# Patient Record
Sex: Female | Born: 1985 | Hispanic: Yes | Marital: Single | Smoking: Never smoker
Health system: Southern US, Community
[De-identification: ages and names within clinical notes are randomized; demographics above are authoritative.]

## PROBLEM LIST (undated history)

## (undated) ENCOUNTER — Inpatient Hospital Stay (HOSPITAL_COMMUNITY): Payer: Self-pay

## (undated) DIAGNOSIS — K219 Gastro-esophageal reflux disease without esophagitis: Secondary | ICD-10-CM

## (undated) DIAGNOSIS — Z789 Other specified health status: Secondary | ICD-10-CM

## (undated) HISTORY — PX: NO PAST SURGERIES: SHX2092

## (undated) HISTORY — DX: Other specified health status: Z78.9

---

## 2005-01-03 ENCOUNTER — Ambulatory Visit (HOSPITAL_COMMUNITY): Admission: RE | Admit: 2005-01-03 | Discharge: 2005-01-03 | Payer: Self-pay | Admitting: *Deleted

## 2005-05-29 ENCOUNTER — Ambulatory Visit: Payer: Self-pay | Admitting: Obstetrics and Gynecology

## 2005-05-29 ENCOUNTER — Inpatient Hospital Stay (HOSPITAL_COMMUNITY): Admission: AD | Admit: 2005-05-29 | Discharge: 2005-06-01 | Payer: Self-pay | Admitting: *Deleted

## 2006-10-25 ENCOUNTER — Encounter: Admission: RE | Admit: 2006-10-25 | Discharge: 2006-10-25 | Payer: Self-pay | Admitting: Occupational Medicine

## 2008-01-08 ENCOUNTER — Ambulatory Visit (HOSPITAL_COMMUNITY): Admission: RE | Admit: 2008-01-08 | Discharge: 2008-01-08 | Payer: Self-pay | Admitting: Obstetrics & Gynecology

## 2008-06-11 ENCOUNTER — Ambulatory Visit: Payer: Self-pay | Admitting: Obstetrics & Gynecology

## 2008-06-12 ENCOUNTER — Ambulatory Visit: Payer: Self-pay | Admitting: Advanced Practice Midwife

## 2008-06-12 ENCOUNTER — Inpatient Hospital Stay (HOSPITAL_COMMUNITY): Admission: AD | Admit: 2008-06-12 | Discharge: 2008-06-14 | Payer: Self-pay | Admitting: Obstetrics & Gynecology

## 2011-02-02 ENCOUNTER — Inpatient Hospital Stay (INDEPENDENT_AMBULATORY_CARE_PROVIDER_SITE_OTHER)
Admission: RE | Admit: 2011-02-02 | Discharge: 2011-02-02 | Disposition: A | Discharge status: Home / Self Care | Payer: Self-pay | Source: Ambulatory Visit | Attending: Family Medicine | Admitting: Family Medicine

## 2011-02-02 DIAGNOSIS — K2990 Gastroduodenitis, unspecified, without bleeding: Secondary | ICD-10-CM

## 2011-02-02 DIAGNOSIS — K2970 Gastritis, unspecified, without bleeding: Secondary | ICD-10-CM

## 2011-02-02 LAB — POCT URINALYSIS DIP (DEVICE)
Bilirubin Urine: NEGATIVE
Glucose, UA: NEGATIVE mg/dL
Hgb urine dipstick: NEGATIVE
Ketones, ur: NEGATIVE mg/dL
Nitrite: NEGATIVE
Specific Gravity, Urine: 1.015 (ref 1.005–1.030)

## 2011-02-02 LAB — POCT PREGNANCY, URINE: Preg Test, Ur: NEGATIVE

## 2011-06-04 LAB — CBC
HCT: 36.9
Hemoglobin: 12.5
MCHC: 33.8
Platelets: 189
RBC: 3.74 — ABNORMAL LOW
RDW: 13.9

## 2011-10-30 ENCOUNTER — Other Ambulatory Visit: Payer: Self-pay | Admitting: Family Medicine

## 2011-10-30 DIAGNOSIS — N63 Unspecified lump in unspecified breast: Secondary | ICD-10-CM

## 2011-11-05 ENCOUNTER — Ambulatory Visit
Admission: RE | Admit: 2011-11-05 | Discharge: 2011-11-05 | Disposition: A | Discharge status: Home / Self Care | Payer: Self-pay | Source: Ambulatory Visit | Attending: Family Medicine | Admitting: Family Medicine

## 2011-11-05 DIAGNOSIS — N63 Unspecified lump in unspecified breast: Secondary | ICD-10-CM

## 2012-09-03 NOTE — L&D Delivery Note (Signed)
Delivery Note At 9:03 PM a viable female was delivered via Vaginal, Spontaneous Delivery (Presentation: Left Occiput Anterior).  APGAR pending; weight pending.   Placenta status: Intact, Spontaneous. Cord: 3 vessels with the following complications: None.  Cord pH: not indicated  Anesthesia:none Episiotomy: n/a Lacerations: n/a Suture Repair: n/a Est. Blood Loss (mL): 350 ml  Mom to postpartum.  Baby to nursery-stable.  Lillia Abed 05/15/2013, 9:25 PM Evaluation and management procedures were performed by Resident physician (Dr. Mal Misty did delivery) under my supervision/collaboration. Chart reviewed, patient examined by me and I agree with management and plan.

## 2012-10-27 ENCOUNTER — Encounter (HOSPITAL_COMMUNITY): Payer: Self-pay | Admitting: *Deleted

## 2012-10-27 ENCOUNTER — Emergency Department (HOSPITAL_COMMUNITY)
Admission: EM | Admit: 2012-10-27 | Discharge: 2012-10-27 | Disposition: A | Discharge status: Home / Self Care | Payer: Self-pay | Attending: Emergency Medicine | Admitting: Emergency Medicine

## 2012-10-27 DIAGNOSIS — K219 Gastro-esophageal reflux disease without esophagitis: Secondary | ICD-10-CM | POA: Insufficient documentation

## 2012-10-27 DIAGNOSIS — O9989 Other specified diseases and conditions complicating pregnancy, childbirth and the puerperium: Secondary | ICD-10-CM | POA: Insufficient documentation

## 2012-10-27 DIAGNOSIS — O210 Mild hyperemesis gravidarum: Secondary | ICD-10-CM | POA: Insufficient documentation

## 2012-10-27 DIAGNOSIS — R1013 Epigastric pain: Secondary | ICD-10-CM | POA: Insufficient documentation

## 2012-10-27 HISTORY — DX: Gastro-esophageal reflux disease without esophagitis: K21.9

## 2012-10-27 LAB — CBC WITH DIFFERENTIAL/PLATELET
Basophils Relative: 0 % (ref 0–1)
Eosinophils Absolute: 0.1 10*3/uL (ref 0.0–0.7)
HCT: 31.8 % — ABNORMAL LOW (ref 36.0–46.0)
Lymphs Abs: 1.8 10*3/uL (ref 0.7–4.0)
Monocytes Absolute: 0.6 10*3/uL (ref 0.1–1.0)
Monocytes Relative: 8 % (ref 3–12)
Neutrophils Relative %: 68 % (ref 43–77)
RBC: 3.45 MIL/uL — ABNORMAL LOW (ref 3.87–5.11)
RDW: 12.3 % (ref 11.5–15.5)
WBC: 7.9 10*3/uL (ref 4.0–10.5)

## 2012-10-27 LAB — URINE MICROSCOPIC-ADD ON

## 2012-10-27 LAB — COMPREHENSIVE METABOLIC PANEL
AST: 15 U/L (ref 0–37)
Albumin: 3.5 g/dL (ref 3.5–5.2)
BUN: 14 mg/dL (ref 6–23)
CO2: 21 mEq/L (ref 19–32)
Calcium: 9.3 mg/dL (ref 8.4–10.5)
Chloride: 101 mEq/L (ref 96–112)
Creatinine, Ser: 0.48 mg/dL — ABNORMAL LOW (ref 0.50–1.10)
Total Protein: 7.5 g/dL (ref 6.0–8.3)

## 2012-10-27 LAB — URINALYSIS, ROUTINE W REFLEX MICROSCOPIC
Glucose, UA: NEGATIVE mg/dL
Hgb urine dipstick: NEGATIVE
Protein, ur: NEGATIVE mg/dL
pH: 6 (ref 5.0–8.0)

## 2012-10-27 LAB — LIPASE, BLOOD: Lipase: 50 U/L (ref 11–59)

## 2012-10-27 MED ORDER — ONDANSETRON 4 MG PO TBDP
8.0000 mg | ORAL_TABLET | Freq: Once | ORAL | Status: AC
  Administered 2012-10-27: 8 mg via ORAL
  Filled 2012-10-27: qty 2

## 2012-10-27 MED ORDER — LIDOCAINE VISCOUS 2 % MT SOLN
20.0000 mL | Freq: Once | OROMUCOSAL | Status: AC
  Administered 2012-10-27: 20 mL via OROMUCOSAL
  Filled 2012-10-27: qty 15

## 2012-10-27 MED ORDER — ONDANSETRON 4 MG PO TBDP: 4.0000 mg | ORAL_TABLET | Freq: Three times a day (TID) | ORAL | Status: DC | PRN

## 2012-10-27 NOTE — ED Notes (Signed)
Pt is [redacted] weeks pregnant and has been experiencing nausea and heartburn.  However, for he last week she has been vomiting 6 x's per day.  For the last 3 days she has been unable to eat anything and is vomiting up "yellow" emesis.  Denies diarrhea.

## 2012-10-27 NOTE — ED Provider Notes (Signed)
History     CSN: [id number]  Arrival date & time 10/27/12  1717   First MD Initiated Contact with Patient 10/27/12 2141      Chief Complaint  Patient presents with  . Abdominal Pain    HPI Rachael Jackson is a 27 y.o. female who presents to the ED with 1 week of burning epigastric pain and vomiting.  Pain moderate in severity, located in midepigastric region, no radiation, burning in sensation, worse with vomiting and lying flat, better with rest.  No diarrhea/constipation.  No changes in urination.  No other symptoms.  Past Medical History  Diagnosis Date  . GERD (gastroesophageal reflux disease)     History reviewed. No pertinent past surgical history.  Family History: Reviewed.  None pertinent.    History  Substance Use Topics  . Smoking status: Never Smoker   . Smokeless tobacco: Not on file  . Alcohol Use: No    Review of Systems  Constitutional: Negative for fever and chills.  HENT: Negative for congestion, rhinorrhea, neck pain and neck stiffness.   Respiratory: Negative for cough and shortness of breath.   Cardiovascular: Negative for chest pain.  Gastrointestinal: Positive for nausea, vomiting and abdominal pain. Negative for diarrhea and abdominal distention.  Endocrine: Negative for polyuria.  Genitourinary: Negative for dysuria.  Skin: Negative for rash.  Neurological: Negative for headaches.  Psychiatric/Behavioral: Negative.   All other systems reviewed and are negative.    Allergies  Review of patient's allergies indicates no known allergies.  Home Medications  No current outpatient prescriptions on file.  BP 94/53  Pulse 69  Temp(Src) 97.4 F (36.3 C) (Oral)  Resp 18  SpO2 98%  LMP 07/18/2012  Physical Exam  Nursing note and vitals reviewed. Constitutional: She is oriented to person, place, and time. She appears well-developed and well-nourished. No distress.  HENT:  Head: Normocephalic and atraumatic.  Right Ear: External ear  normal.  Left Ear: External ear normal.  Nose: Nose normal.  Mouth/Throat: Oropharynx is clear and moist. No oropharyngeal exudate.  Eyes: EOM are normal. Pupils are equal, round, and reactive to light.  Neck: Normal range of motion. Neck supple. No tracheal deviation present.  Cardiovascular: Normal rate.   Pulmonary/Chest: Effort normal and breath sounds normal. No stridor. No respiratory distress. She has no wheezes. She has no rales.  Abdominal: Soft. She exhibits no distension. There is tenderness in the epigastric area. There is no rebound.  Musculoskeletal: Normal range of motion.  Neurological: She is alert and oriented to person, place, and time.  Skin: Skin is warm and dry. She is not diaphoretic.    ED Course  Procedures (including critical care time)  Labs Reviewed  CBC WITH DIFFERENTIAL - Abnormal; Notable for the following:    RBC 3.45 (*)    Hemoglobin 11.4 (*)    HCT 31.8 (*)    All other components within normal limits  COMPREHENSIVE METABOLIC PANEL - Abnormal; Notable for the following:    Sodium 134 (*)    Creatinine, Ser 0.48 (*)    Total Bilirubin 0.2 (*)    All other components within normal limits  LIPASE, BLOOD  URINALYSIS, ROUTINE W REFLEX MICROSCOPIC   No results found.   1. GERD (gastroesophageal reflux disease)       MDM  [id number] at 14.3 weeks who presents to the ED with burning epigastric pain and vomiting similar to feelings she had with similar pregnancy.  Exam benign.  No concern for ectopic.  No suprapubic pain, RLQ pain or LLQ pain.  US showing intrauterine pregnancy with FHT of approx 150.  Will treat with zofran.  Patient much improved after viscous lidocaine.  No evidence of angina or MI symptoms.  Patient safe for discharge.  Patient discharged.   Arloa Koh, MD 10/28/12 0981

## 2012-10-27 NOTE — ED Notes (Signed)
Patient states that over the past few days she started hurting in the epigastric area.  +N/V at times esp after eating.  Stated she had this trouble with her other 2 preg. But the pain did not last as long.

## 2012-10-28 NOTE — ED Provider Notes (Signed)
  I performed a history and physical examination of Rachael Jackson and discussed her management with Dr. Piedad Climes.  I agree with the history, physical, assessment, and plan of care, with the following exceptions: None    Rhet Rorke, Elvis Coil, MD 10/28/12 [phone]

## 2012-10-30 LAB — URINE CULTURE

## 2012-10-31 ENCOUNTER — Telehealth (HOSPITAL_COMMUNITY): Payer: Self-pay | Admitting: Emergency Medicine

## 2012-10-31 NOTE — ED Notes (Signed)
+  Urine. 15,000 colonies. No need for further treatment.

## 2012-10-31 NOTE — ED Notes (Signed)
Patient has +Urine culture. Checking to see if appropriately treated. °

## 2012-11-19 ENCOUNTER — Emergency Department (HOSPITAL_COMMUNITY)
Admission: EM | Admit: 2012-11-19 | Discharge: 2012-11-19 | Disposition: A | Discharge status: Home / Self Care | Payer: Self-pay | Source: Home / Self Care

## 2012-11-19 ENCOUNTER — Encounter (HOSPITAL_COMMUNITY): Payer: Self-pay

## 2012-11-19 DIAGNOSIS — K089 Disorder of teeth and supporting structures, unspecified: Secondary | ICD-10-CM

## 2012-11-19 DIAGNOSIS — K0889 Other specified disorders of teeth and supporting structures: Secondary | ICD-10-CM

## 2012-11-19 NOTE — ED Notes (Signed)
Patient complains of bilateral tooth pain Has a rash on arms Purple nail on foot

## 2012-11-19 NOTE — ED Notes (Signed)
[facility]

## 2012-11-19 NOTE — ED Notes (Signed)
Patient Demographics  Rachael Jackson, is a 27 y.o. female  ZOX:[id number]  WJX:[id number]  DOB - 14-Nov-1985  Chief Complaint  Patient presents with  . Dental Pain  . Rash        Subjective:   Rachael Jackson  who is 4 months pregnant presents to get a referral for her dentist as she is developing some sensitivity to cold and hot liquids in her right upper molar and left lower molar. She has had dental filling done about one year ago in one of these tooth, no jaw swelling, no fever chills, no tenderness.  And a viral-like illness one week ago after which she developed a small non-itchy macular rash in the right forearm area, the rash is itself resolving    Objective:    Filed Vitals:   11/19/12 1116  BP: 107/49  Pulse: 114 repeat bedside 88   Temp: 97.7 F (36.5 C)  TempSrc: Oral  SpO2: 100%     Exam  Awake Alert, Oriented X 3, No new F.N deficits, Normal affect [place].AT,PERRAL Supple Neck,No JVD, No cervical lymphadenopathy appriciated.  Symmetrical Chest wall movement, Good air movement bilaterally, CTAB RRR,No Gallops,Rubs or new Murmurs, No Parasternal Heave +ve B.Sounds, Abd Soft, Non tender, No organomegaly appriciated, No rebound - guarding or rigidity. No Cyanosis, Clubbing or edema, No new Rash or bruise Right forearm has a small area of macular rash which is resolving this happened about one week ago after a viral URI. Its not itchy    Data Review   CBC No results found for this basename: WBC, HGB, HCT, PLT, MCV, MCH, MCHC, RDW, NEUTRABS, LYMPHSABS, MONOABS, EOSABS, BASOSABS, BANDABS, BANDSABD,  in the last 168 hours  Chemistries   No results found for this basename: NA, K, CL, CO2, GLUCOSE, BUN, CREATININE, GFRCGP, CALCIUM, MG, AST, ALT, ALKPHOS, BILITOT,  in the last 168 hours ------------------------------------------------------------------------------------------------------------------ No results found for this basename: HGBA1C,  in the  last 72 hours ------------------------------------------------------------------------------------------------------------------ No results found for this basename: CHOL, HDL, LDLCALC, TRIG, CHOLHDL, LDLDIRECT,  in the last 72 hours ------------------------------------------------------------------------------------------------------------------ No results found for this basename: TSH, T4TOTAL, FREET3, T3FREE, THYROIDAB,  in the last 72 hours ------------------------------------------------------------------------------------------------------------------ No results found for this basename: VITAMINB12, FOLATE, FERRITIN, TIBC, IRON, RETICCTPCT,  in the last 72 hours  Coagulation profile  No results found for this basename: INR, PROTIME,  in the last 168 hours     Prior to Admission medications   Medication Sig Start Date End Date Taking? Authorizing Provider  ondansetron (ZOFRAN-ODT) 4 MG disintegrating tablet Take 1 tablet (4 mg total) by mouth every 8 (eight) hours as needed for nausea. 10/27/12   Arloa Koh, MD     Assessment & Plan   Dental sensitivity and pain to hot and cold liquids. Exam is stable, no signs of abscess, no fever chills, have referred her to dentist Dr. Mayford Knife, advise her to brush her teeth twice a day, if she develops any jaw swelling fever chills to come back early.   4 months pregnant patient with viral URI 1 week ago with a small macular rash in the right forearm which is now resolving, rash is not itchy, have requested her to see her OB/GYN immediately.    Follow-up Information   Follow up with [facility] physician in a week. (Show them ear rash in the right arm)        Leroy Sea M.D on 11/19/2012 at 11:30 AM   Leroy Sea, MD 11/19/12 [phone]

## 2012-11-25 ENCOUNTER — Inpatient Hospital Stay (HOSPITAL_COMMUNITY): Payer: No Typology Code available for payment source

## 2012-11-25 ENCOUNTER — Inpatient Hospital Stay (HOSPITAL_COMMUNITY)
Admission: AD | Admit: 2012-11-25 | Discharge: 2012-11-25 | Disposition: A | Discharge status: Home / Self Care | Payer: No Typology Code available for payment source | Source: Ambulatory Visit | Attending: Family Medicine | Admitting: Family Medicine

## 2012-11-25 ENCOUNTER — Encounter (HOSPITAL_COMMUNITY): Payer: Self-pay

## 2012-11-25 DIAGNOSIS — O2390 Unspecified genitourinary tract infection in pregnancy, unspecified trimester: Secondary | ICD-10-CM | POA: Insufficient documentation

## 2012-11-25 DIAGNOSIS — N72 Inflammatory disease of cervix uteri: Secondary | ICD-10-CM | POA: Insufficient documentation

## 2012-11-25 DIAGNOSIS — R1031 Right lower quadrant pain: Secondary | ICD-10-CM | POA: Insufficient documentation

## 2012-11-25 DIAGNOSIS — R1032 Left lower quadrant pain: Secondary | ICD-10-CM | POA: Insufficient documentation

## 2012-11-25 DIAGNOSIS — M549 Dorsalgia, unspecified: Secondary | ICD-10-CM | POA: Insufficient documentation

## 2012-11-25 DIAGNOSIS — O2692 Pregnancy related conditions, unspecified, second trimester: Secondary | ICD-10-CM

## 2012-11-25 DIAGNOSIS — N949 Unspecified condition associated with female genital organs and menstrual cycle: Secondary | ICD-10-CM | POA: Insufficient documentation

## 2012-11-25 LAB — CBC
HCT: 28.3 % — ABNORMAL LOW (ref 36.0–46.0)
Hemoglobin: 9.9 g/dL — ABNORMAL LOW (ref 12.0–15.0)
MCHC: 35 g/dL (ref 30.0–36.0)
MCV: 93.1 fL (ref 78.0–100.0)
WBC: 7.4 10*3/uL (ref 4.0–10.5)

## 2012-11-25 LAB — URINALYSIS, ROUTINE W REFLEX MICROSCOPIC
Bilirubin Urine: NEGATIVE
Ketones, ur: NEGATIVE mg/dL
Leukocytes, UA: NEGATIVE
Nitrite: NEGATIVE
Protein, ur: NEGATIVE mg/dL
Urobilinogen, UA: 0.2 mg/dL (ref 0.0–1.0)

## 2012-11-25 LAB — WET PREP, GENITAL
Trich, Wet Prep: NONE SEEN
Yeast Wet Prep HPF POC: NONE SEEN

## 2012-11-25 MED ORDER — ACETAMINOPHEN 325 MG PO TABS
650.0000 mg | ORAL_TABLET | Freq: Once | ORAL | Status: AC
  Administered 2012-11-25: 650 mg via ORAL
  Filled 2012-11-25: qty 2

## 2012-11-25 MED ORDER — AZITHROMYCIN 250 MG PO TABS
1000.0000 mg | ORAL_TABLET | Freq: Once | ORAL | Status: AC
  Administered 2012-11-25: 1000 mg via ORAL
  Filled 2012-11-25: qty 4

## 2012-11-25 NOTE — MAU Provider Note (Signed)
Chart reviewed and agree with management and plan.  

## 2012-11-25 NOTE — MAU Provider Note (Signed)
History     CSN: [id number]  Arrival date and time: 11/25/12 1405   First Provider Initiated Contact with Patient 11/25/12 1506      Chief Complaint  Patient presents with  . Vaginal Discharge  . Back Pain   Vaginal Discharge The patient's primary symptoms include a vaginal discharge. Associated symptoms include back pain.  Back Pain  Pt is a 27 y/o G3P2 at GA [redacted] wks (by LMP, which is uncertain) who presents today for brown vaginal discharge and lower back pain. She reports that the lower back pain started yesterday afternoon and is worse when standing up and walking. It is better when sitting. It is located at her lumbo-sacral area bilaterally. There is no radiation. She is not aware of any injuries. She describes it as sharp and rates it as 8/10. It currently does not hurt.  She also reports some abnormal vaginal discharge beginning today. She describes it as thick and brown. It has been a small amount and not enough to require a pad. She has not seen any clots or tissue. She denies any vaginal itching, pain/discomfort, or blood. She denies dysuria, frequency. She has been sexually active only with her husband and for only several times in the last 3 months.  She is also complaining of abdominal pain, located in her LLQ and RLQ. 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   She is anxious and worried that there may be a problem with her pregnancy since she never experienced these symptoms with previous pregnancies. She has not had any prenatal care as of yet. She is in the [employer] program and has her first prenatal appointment scheduled on 12/04/12. She is taking a prenatal vitamin.   Past Medical History  Diagnosis Date  . GERD (gastroesophageal reflux disease)     Past Surgical History  Procedure Laterality Date  . No past surgeries      Family History  Problem Relation Age of Onset  . Alcohol abuse Neg Hx      History  Substance Use Topics  . Smoking status: Never Smoker   . Smokeless tobacco: Not on file  . Alcohol Use: No    Allergies: No Known Allergies  Prescriptions prior to admission  Medication Sig Dispense Refill  . Prenatal Vit-Fe Fumarate-FA (PRENATAL MULTIVITAMIN) TABS Take 1 tablet by mouth daily at 12 noon.        Review of Systems  Genitourinary: Positive for vaginal discharge.  Musculoskeletal: Positive for back pain.   Pertinent positives and negatives discussed in HPI. Physical Exam   Blood pressure 113/63, pulse 87, temperature 97.9 F (36.6 C), temperature source Oral, resp. rate 16, height 5' 1.5" (1.562 m), weight 64.071 kg (141 lb 4 oz), last menstrual period 07/18/2012.  Physical Exam  Constitutional: She appears well-developed and well-nourished. She appears distressed (worried).  HENT:  Head: Normocephalic.  Neck: Normal range of motion.  Respiratory: Effort normal. No respiratory distress.  GI: Soft. She exhibits no distension. There is tenderness in the right lower quadrant and left lower quadrant. There is no rigidity, no guarding and no CVA tenderness.  Genitourinary: Uterus normal. There is no rash, tenderness or lesion on the right labia. There is no rash, tenderness or lesion on the left labia. Cervix exhibits motion tenderness, discharge (clear, thick) and friability. Right adnexum displays tenderness. Right adnexum displays no mass and no fullness. Left adnexum displays tenderness.  Left adnexum displays no mass and no fullness. No erythema, tenderness or bleeding around the vagina. No signs of injury around the vagina. No vaginal discharge found.  Psychiatric: She has a normal mood and affect. Her behavior is normal.   Results for orders placed during the hospital encounter of 11/25/12 (from the past 24 hour(s))  URINALYSIS, ROUTINE W REFLEX MICROSCOPIC     Status: Abnormal   Collection Time    11/25/12  2:24 PM      Result Value Range   Color,  Urine YELLOW  YELLOW   APPearance CLEAR  CLEAR   Specific Gravity, Urine >1.030 (*) 1.005 - 1.030   pH 6.0  5.0 - 8.0   Glucose, UA NEGATIVE  NEGATIVE mg/dL   Hgb urine dipstick NEGATIVE  NEGATIVE   Bilirubin Urine NEGATIVE  NEGATIVE   Ketones, ur NEGATIVE  NEGATIVE mg/dL   Protein, ur NEGATIVE  NEGATIVE mg/dL   Urobilinogen, UA 0.2  0.0 - 1.0 mg/dL   Nitrite NEGATIVE  NEGATIVE   Leukocytes, UA NEGATIVE  NEGATIVE  ABO/RH     Status: None   Collection Time    11/25/12  3:36 PM      Result Value Range   ABO/RH(D) O POS    CBC     Status: Abnormal   Collection Time    11/25/12  3:36 PM      Result Value Range   WBC 7.4  4.0 - 10.5 K/uL   RBC 3.04 (*) 3.87 - 5.11 MIL/uL   Hemoglobin 9.9 (*) 12.0 - 15.0 g/dL   HCT 09.8 (*) 11.9 - 14.7 %   MCV 93.1  78.0 - 100.0 fL   MCH 32.6  26.0 - 34.0 pg   MCHC 35.0  30.0 - 36.0 g/dL   RDW 82.9  56.2 - 13.0 %   Platelets 190  150 - 400 K/uL  WET PREP, GENITAL     Status: Abnormal   Collection Time    11/25/12  3:50 PM      Result Value Range   Yeast Wet Prep HPF POC NONE SEEN  NONE SEEN   Trich, Wet Prep NONE SEEN  NONE SEEN   Clue Cells Wet Prep HPF POC NONE SEEN  NONE SEEN   WBC, Wet Prep HPF POC MODERATE (*) NONE SEEN    MAU Course  Procedures  Pt is a G3P2 at [redacted]w[redacted]d by U/S today who presented for brown vaginal discharge, lower back pain, and lower abdominal pain. Pt had a friable cervix and cervical tenderness on PE, and wet prep and U/A were unremarkable. GC/Chlamydia are pending but treating today for cervicitis presumptively based on clinical appearance. Suspect that back pain is due to ligament pain.   Assessment and Plan  Acute Cervicitis - GC/Chlamydia - will call if results positive - wet prep - ultrasound - CBC - Hgb = 9.9. Recommended she take a PNV with extra iron or an iron supplement. Follow-up at prenatal appointment. - ABO/Rh - Rx. Azithromycin 1g PO x 1 - Return to MAU if develops a fever, experiences vaginal  bleeding, worsening pain or discharge  Lorna Dibble, PA-S 11/25/2012, 3:28 PM   27 y.o. female with brown discharge in second trimester pregnancy. I have examined this patient and treated her for cervicitis. She is feeling better after tylenol and is stable for discharge home. We have discussed her mild anemia and need for prenatal vitamins with iron and healthy diet. Patient voices understanding.  I have reviewed  this patient's vital signs, nurses notes, appropriate labs and imaging.

## 2012-11-25 NOTE — MAU Note (Signed)
Pt states unsure of actual LMP, roughly mid to end of November. Here for brown vaginal discharge and back pain. Intermittent cramping, rates 5/10 now, this am was much worse.

## 2012-11-26 LAB — GC/CHLAMYDIA PROBE AMP
CT Probe RNA: NEGATIVE
GC Probe RNA: NEGATIVE

## 2012-12-04 ENCOUNTER — Other Ambulatory Visit: Payer: No Typology Code available for payment source

## 2012-12-04 DIAGNOSIS — Z331 Pregnant state, incidental: Secondary | ICD-10-CM

## 2012-12-04 NOTE — Progress Notes (Signed)
PRENATAL LABS DONE TODAY Marlita Keil 

## 2012-12-05 LAB — SICKLE CELL SCREEN: Sickle Cell Screen: NEGATIVE

## 2012-12-05 LAB — HIV ANTIBODY (ROUTINE TESTING W REFLEX): HIV: NONREACTIVE

## 2012-12-06 LAB — OBSTETRIC PANEL
Antibody Screen: NEGATIVE
Basophils Relative: 0 % (ref 0–1)
Eosinophils Absolute: 0.1 10*3/uL (ref 0.0–0.7)
Eosinophils Relative: 1 % (ref 0–5)
Hemoglobin: 11.3 g/dL — ABNORMAL LOW (ref 12.0–15.0)
Lymphs Abs: 1.2 10*3/uL (ref 0.7–4.0)
MCH: 31.8 pg (ref 26.0–34.0)
MCHC: 32.9 g/dL (ref 30.0–36.0)
MCV: 96.6 fL (ref 78.0–100.0)
Monocytes Relative: 6 % (ref 3–12)
RBC: 3.55 MIL/uL — ABNORMAL LOW (ref 3.87–5.11)
Rh Type: POSITIVE

## 2012-12-10 ENCOUNTER — Ambulatory Visit (INDEPENDENT_AMBULATORY_CARE_PROVIDER_SITE_OTHER): Payer: No Typology Code available for payment source | Admitting: Family Medicine

## 2012-12-10 ENCOUNTER — Other Ambulatory Visit: Payer: Self-pay | Admitting: Family Medicine

## 2012-12-10 ENCOUNTER — Encounter: Payer: Self-pay | Admitting: Family Medicine

## 2012-12-10 VITALS — BP 101/48 | Temp 97.8°F | Wt 144.4 lb

## 2012-12-10 DIAGNOSIS — Z3401 Encounter for supervision of normal first pregnancy, first trimester: Secondary | ICD-10-CM

## 2012-12-10 DIAGNOSIS — Z3490 Encounter for supervision of normal pregnancy, unspecified, unspecified trimester: Secondary | ICD-10-CM | POA: Insufficient documentation

## 2012-12-10 DIAGNOSIS — Z3400 Encounter for supervision of normal first pregnancy, unspecified trimester: Secondary | ICD-10-CM

## 2012-12-10 DIAGNOSIS — Z124 Encounter for screening for malignant neoplasm of cervix: Secondary | ICD-10-CM

## 2012-12-10 LAB — GLUCOSE, CAPILLARY: Comment 1: 1

## 2012-12-10 NOTE — Patient Instructions (Addendum)
Ha sido un placer verle hoy. - Por favor tome las tabletas prenatales 1 tableta diaria - Yo le comunicare si los resultados de sus analisis estan [place], de lo contrario lo conversaremos en su proxima cita. - Haga su proxima cita en 4 semanas.

## 2012-12-10 NOTE — Progress Notes (Signed)
Rachael Jackson is a 27 y.o. G3P2002 at [redacted]w[redacted]d. That comes for her first OB visit. PMHx and PFHx unremarkable. OB Hx: 2 Uncomplicated pregnancies with SVD of term/normal weight babies.  On this pregnancy: She reports had bloody tinted vaginal discharge and went to [facility], cultures for [facility] and CT were taken and was treated with Azithromycin resolving her symptoms. She also report Hx of GERD with epigastric pain that also has resolved. She reports feeling well and has not present complaints.  Physical Exam: Gen:  NAD HEENT: Moist mucous membranes. Neck supple. No goiter or adenopathies.  CV: Regular rate and rhythm, no murmurs rubs or gallops PULM: Clear to auscultation bilaterally. No wheezes/rales/rhonchi Beasts: no masses, normal skin, no nipple discharge. No axillary admopathies ABD: Soft, non tender, normal bowel sounds EXT: No edema Neuro: Alert and oriented x3. No focalization  Psych: Normal mood and affect. Vulva and perianal area: Normal, mild varices on left labia majora. Speculum: Vagina and cervix of normal appearance, no friability, thin white discharge. Bimanual exam: Uterus anteverted, no adnexal masses. No cervical motion tenderness. Closed cervix.  A/P Declines genetic testing. PHQ-9 scored 1 on item #3 (trouble sleeping too much) [facility] PMH form filled and no Risks identified. Pap smear and GC/CT obtained today Labs reviewed and WNL. 1h GTT obtained today as well as Varicella titers OB U/S at 20 weeks. Bleeding and pain precautions discussed. Continue prenatal vitamins. Next appointment in 4 weeks.

## 2012-12-11 ENCOUNTER — Encounter: Payer: Self-pay | Admitting: Family Medicine

## 2013-01-06 ENCOUNTER — Ambulatory Visit (INDEPENDENT_AMBULATORY_CARE_PROVIDER_SITE_OTHER): Payer: No Typology Code available for payment source | Admitting: Family Medicine

## 2013-01-06 VITALS — BP 97/54 | Temp 98.1°F | Wt 148.4 lb

## 2013-01-06 DIAGNOSIS — Z3490 Encounter for supervision of normal pregnancy, unspecified, unspecified trimester: Secondary | ICD-10-CM

## 2013-01-06 DIAGNOSIS — Z3480 Encounter for supervision of other normal pregnancy, unspecified trimester: Secondary | ICD-10-CM

## 2013-01-06 DIAGNOSIS — Z3689 Encounter for other specified antenatal screening: Secondary | ICD-10-CM

## 2013-01-06 NOTE — Patient Instructions (Addendum)
Mtodo para contar los movimientos fetales (Fetal Movement Counts) Nombre de la paciente: __________________________________________________ Rachael Jackson probable de parto:____________________ En los embarazos de alto riesgo se recomienda contar las pataditas, pero tambin es una buena idea que lo hagan todas las [place]. Comience a contarlas a las 28 semanas de embarazo. Los movimientos fetales aumentan luego de una comida Immunologist o de comer o beber algo dulce (el nivel de azcar en la sangre est ms alto). Tambin es importante beber gran cantidad de lquidos (hidratarse bien) antes de contar. Si se recuesta sobre el lado izquierdo mejorar la Designer, industrial/product, o puede sentarse en una silla cmoda con los brazos sobre el abdomen y sin distracciones que la rodeen. CONTANDO  Trate de contar a la [employer] lo haga.  Marque el da y la hora y vea cunto le lleva sentir 10 movimientos (patadas, agitaciones, sacudones, vueltas). Debe sentir al menos 10 movimientos en 2 horas. Probablemente sienta los 10 movimientos en menos de dos horas. Si no los siente, espere una hora y cuente nuevamente. Luego de [employer] tendr un patrn.  Debemos observar si hay cambios en el patrn o no hay suficientes pataditas en 2 horas. Le lleva ms tiempo contar los 10 movimientos? SOLICITE ATENCIN MDICA SI:  Siente menos de 10 pataditas en 2 horas. Intntelo dos veces.  No siente movimientos durante 1 hora.  El patrn se modifica o le lleva ms tiempo Art gallery manager las 10 pataditas.  Siente que el beb no se mueve como lo hace habitualmente. Fecha: ____________ Movimientos: ____________ Comienzo hora: ____________ Cephas Darby: ____________ Rachael Jackson: ____________ Movimientos: ____________ Comienzo hora: ____________ Cephas Darby: ____________ Rachael Jackson: ____________ Movimientos: ____________ Comienzo hora: ____________ Cephas Darby: ____________ Rachael Jackson: ____________ Movimientos: ____________ Comienzo hora:  ____________ Cephas Darby: ____________ Rachael Jackson: ____________ Movimientos: ____________ Comienzo hora: ____________ Cephas Darby: ____________ Rachael Jackson: ____________ Movimientos: ____________ Comienzo hora: ____________ Cephas Darby: ____________ Rachael Jackson: ____________ Movimientos: ____________ Comienzo hora: ____________ Cephas Darby: ____________  Rachael Jackson: ____________ Movimientos: ____________ Comienzo hora: ____________ Cephas Darby: ____________ Rachael Jackson: ____________ Movimientos: ____________ Comienzo hora: ____________ Cephas Darby: ____________ Rachael Jackson: ____________ Movimientos: ____________ Comienzo hora: ____________ Cephas Darby: ____________ Rachael Jackson: ____________ Movimientos: ____________ Comienzo hora: ____________ Cephas Darby: ____________ Rachael Jackson: ____________ Movimientos: ____________ Comienzo hora: ____________ Cephas Darby: ____________ Rachael Jackson: ____________ Movimientos: ____________ Comienzo hora: ____________ Cephas Darby: ____________ Rachael Jackson: ____________ Movimientos: ____________ Comienzo hora: ____________ Cephas Darby: ____________  Rachael Jackson: ____________ Movimientos: ____________ Comienzo hora: ____________ Cephas Darby: ____________ Rachael Jackson: ____________ Movimientos: ____________ Comienzo hora: ____________ Cephas Darby: ____________ Rachael Jackson: ____________ Movimientos: ____________ Comienzo hora: ____________ Fin hora: ____________  Document Released: 11/27/2007 Document Revised: 11/12/2011 ExitCare Patient Information 2013 [employer]  Haz una cita en 4 semanas

## 2013-01-06 NOTE — Progress Notes (Signed)
Rachael Jackson is a 27 y.o. G3P2002 at [redacted]w[redacted]d for routine follow up.  She reports no complaints.  See flow sheet for details.  A/P: Pregnancy at [redacted]w[redacted]d.  Doing well.   Pregnancy issues include none.  1h GTT wnl. GC/CT and pap smear negative. Anatomy scan ordered today.  Preterm labor precautions reviewed. Follow up 4 weeks.

## 2013-01-08 ENCOUNTER — Ambulatory Visit (HOSPITAL_COMMUNITY)
Admission: RE | Admit: 2013-01-08 | Discharge: 2013-01-08 | Disposition: A | Discharge status: Home / Self Care | Payer: No Typology Code available for payment source | Source: Ambulatory Visit | Attending: Family Medicine | Admitting: Family Medicine

## 2013-01-08 DIAGNOSIS — Z1389 Encounter for screening for other disorder: Secondary | ICD-10-CM | POA: Insufficient documentation

## 2013-01-08 DIAGNOSIS — O358XX0 Maternal care for other (suspected) fetal abnormality and damage, not applicable or unspecified: Secondary | ICD-10-CM | POA: Insufficient documentation

## 2013-01-08 DIAGNOSIS — Z363 Encounter for antenatal screening for malformations: Secondary | ICD-10-CM | POA: Insufficient documentation

## 2013-01-08 DIAGNOSIS — Z3689 Encounter for other specified antenatal screening: Secondary | ICD-10-CM

## 2013-01-14 ENCOUNTER — Encounter: Payer: Self-pay | Admitting: Family Medicine

## 2013-01-14 ENCOUNTER — Telehealth: Payer: Self-pay | Admitting: Family Medicine

## 2013-01-14 ENCOUNTER — Ambulatory Visit (INDEPENDENT_AMBULATORY_CARE_PROVIDER_SITE_OTHER): Payer: No Typology Code available for payment source | Admitting: Family Medicine

## 2013-01-14 VITALS — BP 101/51 | HR 89 | Temp 97.6°F | Ht 61.5 in | Wt 150.0 lb

## 2013-01-14 DIAGNOSIS — L282 Other prurigo: Secondary | ICD-10-CM | POA: Insufficient documentation

## 2013-01-14 MED ORDER — TRIAMCINOLONE ACETONIDE 0.025 % EX OINT: TOPICAL_OINTMENT | Freq: Two times a day (BID) | CUTANEOUS | Status: DC

## 2013-01-14 MED ORDER — CETIRIZINE HCL 10 MG PO TABS: 10.0000 mg | ORAL_TABLET | Freq: Every day | ORAL | Status: DC | PRN

## 2013-01-14 NOTE — Telephone Encounter (Signed)
Has a rash all over that's itchy and she is 5 mo pregnant.  Wants to know what she can do

## 2013-01-14 NOTE — Patient Instructions (Signed)
You may have allergic reaction related to grass, sensitive during pregnancy. Use the triamcinolone ointment for rash. You may take cetirizine pill at night for itching if desired. Please make sure to call if symptoms worsen or fail to improve.

## 2013-01-14 NOTE — Progress Notes (Signed)
  Subjective:    Patient ID: Rachael Jackson, female    DOB: May 08, 1986, 27 y.o.   MRN: [id number]  Rash    27 yo G3P2 in 2nd trimester complaint of itchy rash. This started on Sunday after she was sitting on the grass with bare legs. Noted some itching on the back of her legs. Over the course of the day this worsened and spread to her back, arms, abdomen. She notes it has been stable, causing significant pruritus. There is some redness and rough skin. She has tried to use lemon, alcohol, OTC cortisone that helped mildly. She otherwise feels well, denies any nausea, emesis, fever, chills, swelling, pain. No family members have a similar rash. She is accompanied by her 2 small sons today.  She notes good fetal movement, no vaginal bleeding or discharge or abdominal pain.  Review of Systems  Skin: Positive for rash.   See HPI otherwise negative.  reports that she has never smoked. She does not have any smokeless tobacco history on file.     Objective:   Physical Exam  Vitals reviewed. Constitutional: She is oriented to person, place, and time. She appears well-developed and well-nourished. No distress.  HENT:  Head: Normocephalic and atraumatic.  Mouth/Throat: Oropharynx is clear and moist. No oropharyngeal exudate.  Eyes: EOM are normal. Pupils are equal, round, and reactive to light.  Neck: Normal range of motion.  Cardiovascular: Normal rate, regular rhythm and normal heart sounds.   No murmur heard. Pulmonary/Chest: Effort normal and breath sounds normal.  Abdominal: There is no tenderness.  Neurological: She is alert and oriented to person, place, and time.  Skin: She is not diaphoretic.  Generalized fine sandpaper type maculopapular rash with dry scaly skin noted posterior legs, back, abdomen, flexural surfaces of arms. There is mild erythema and faint excoriations. There are no pustules, ulcers or bleeding tenderness.          Assessment & Plan:

## 2013-01-14 NOTE — Assessment & Plan Note (Signed)
Likely atopic versus contact dermatitis from sitting on the grass. Also less likely prurigo of pregnancy. Do not think this represents one of the pathologic prurigo of pregnancy given her notable rash and recent exposure to allergens. Recommend topical steroid treatment with low potency steroid. She may try cetirizine each bedtime when necessary for itching. Discussed avoiding unnecessary medications if possible. Followup if symptoms worsen or fail to improve.

## 2013-02-04 ENCOUNTER — Ambulatory Visit (INDEPENDENT_AMBULATORY_CARE_PROVIDER_SITE_OTHER): Payer: No Typology Code available for payment source | Admitting: Family Medicine

## 2013-02-04 ENCOUNTER — Encounter: Payer: Self-pay | Admitting: Family Medicine

## 2013-02-04 VITALS — BP 91/44 | Temp 98.1°F | Wt 151.0 lb

## 2013-02-04 DIAGNOSIS — Z3490 Encounter for supervision of normal pregnancy, unspecified, unspecified trimester: Secondary | ICD-10-CM

## 2013-02-04 DIAGNOSIS — Z331 Pregnant state, incidental: Secondary | ICD-10-CM

## 2013-02-04 NOTE — Patient Instructions (Addendum)
Evaluacin de los movimientos fetales  (Fetal Movement Counts) [place] del paciente: Rachael Jackson       [place] de parto estimada: 05/15/2013 La evaluacin de los movimientos fetales es muy recomendable en los embarazos de alto riesgo, pero tambin es una buena idea que lo hagan todas las [place]. El Firefighter que comience a contarlos a las 28 semanas de [place]. Los movimientos fetales suelen aumentar:   Despus de Animator.  Despus de la actividad fsica.  Despus de comer o beber [employer] o fro.  En reposo. Preste atencin cuando sienta que el beb est ms activo. Esto le ayudar a notar un patrn de ciclos de vigilia y sueo de su beb y cules son los factores que contribuyen a un aumento de los movimientos fetales. Es importante llevar a cabo un recuento de movimientos fetales, al mismo tiempo cada da, cuando el beb normalmente est ms activo.  CMO CONTAR LOS MOVIMIENTOS FETALES 1. Busque un lugar tranquilo y cmodo para sentarse o recostarse sobre el lado izquierdo. Al recostarse sobre su lado izquierdo, le proporciona una mejor circulacin de [place] y oxgeno al beb. 2. Anote el da y la hora en una hoja de papel o en un diario. 3. Comience contando las pataditas, revoloteos, chasquidos, vueltas o pinchazos en un perodo de 2 horas. Debe sentir al menos 10 movimientos en 2 horas. 4. Si no siente 10 movimientos en 2 horas, espere 2  3 horas y cuente de nuevo. Busque cambios en el patrn o si no cuenta lo suficiente en 2 horas. SOLICITE ATENCIN MDICA SI:   Siente menos de 10 pataditas en 2 horas, en dos intentos.  No hay movimientos durante una hora.  El patrn se modifica o le lleva ms tiempo Art gallery manager las 10 pataditas.  Siente que el beb no se mueve como lo hace habitualmente. Fecha: ____________ Movimientos: ____________ Stevan Born inicio: ____________ Stevan Born finalizacin: ____________  Franco Nones: ____________ Movimientos:  ____________ Stevan Born inicio: ____________ Stevan Born finalizacin: ____________  Franco Nones: ____________ Movimientos: ____________ Stevan Born inicio: ____________ Stevan Born finalizacin: ____________  Franco Nones: ____________ Movimientos: ____________ Stevan Born inicio: ____________ Stevan Born finalizacin: ____________  Franco Nones: ____________ Movimientos: ____________ Stevan Born inicio: ____________ Mammie Russian de finalizacin: ____________  Franco Nones: ____________ Movimientos: ____________ Mammie Russian de inicio: ____________ Mammie Russian de finalizacin: ____________  Franco Nones: ____________ Movimientos: ____________ Mammie Russian de inicio: ____________ Mammie Russian de finalizacin: ____________  Franco Nones: ____________ Movimientos: ____________ Mammie Russian de inicio: ____________ Mammie Russian de finalizacin: ____________  Franco Nones: ____________ Movimientos: ____________ Stevan Born inicio: ____________ Mammie Russian de finalizacin: ____________  Franco Nones: ____________ Movimientos: ____________ Stevan Born inicio: ____________ Stevan Born finalizacin: ____________  Franco Nones: ____________ Movimientos: ____________ Stevan Born inicio: ____________ Stevan Born finalizacin: ____________  Franco Nones: ____________ Movimientos: ____________ Stevan Born inicio: ____________ Mammie Russian de finalizacin: ____________

## 2013-02-04 NOTE — Progress Notes (Signed)
Rachael Jackson is a 27 y.o. G3P2002 at [redacted]w[redacted]d for routine follow up.  Rachael Jackson reports no complaints.  See flow sheet for details.  A/P: Pregnancy at [redacted]w[redacted]d.  Doing well.   OB U/S reviewed and wnl Pregnancy issues include none Preterm labor precautions and kick count reviewed. Follow up 4 weeks.  Next visit will order third trimester labs (CBC, RPR, HIV and second GTT) and Tdap.

## 2013-02-07 ENCOUNTER — Encounter (HOSPITAL_COMMUNITY): Payer: Self-pay | Admitting: *Deleted

## 2013-02-07 ENCOUNTER — Inpatient Hospital Stay (HOSPITAL_COMMUNITY)
Admission: AD | Admit: 2013-02-07 | Discharge: 2013-02-08 | Disposition: A | Discharge status: Home / Self Care | Payer: No Typology Code available for payment source | Source: Ambulatory Visit | Attending: Obstetrics & Gynecology | Admitting: Obstetrics & Gynecology

## 2013-02-07 DIAGNOSIS — M545 Low back pain, unspecified: Secondary | ICD-10-CM | POA: Insufficient documentation

## 2013-02-07 DIAGNOSIS — R109 Unspecified abdominal pain: Secondary | ICD-10-CM | POA: Insufficient documentation

## 2013-02-07 DIAGNOSIS — O99891 Other specified diseases and conditions complicating pregnancy: Secondary | ICD-10-CM | POA: Insufficient documentation

## 2013-02-07 DIAGNOSIS — O26892 Other specified pregnancy related conditions, second trimester: Secondary | ICD-10-CM

## 2013-02-07 LAB — URINALYSIS, ROUTINE W REFLEX MICROSCOPIC
Glucose, UA: NEGATIVE mg/dL
Hgb urine dipstick: NEGATIVE
Specific Gravity, Urine: 1.025 (ref 1.005–1.030)
Urobilinogen, UA: 0.2 mg/dL (ref 0.0–1.0)
pH: 6 (ref 5.0–8.0)

## 2013-02-07 LAB — URINE MICROSCOPIC-ADD ON

## 2013-02-07 NOTE — MAU Note (Signed)
Woke up with back pain but went to work. HAve been having lower abd cramping and pressure.

## 2013-02-07 NOTE — MAU Provider Note (Signed)
History     CSN: [id number]  Arrival date and time: 02/07/13 2222   None     Chief Complaint  Patient presents with  . Abdominal Cramping  . Back Pain  . Pelvic Pain   HPI Rachael Jackson is a 27 y.o. G79P2002 female at [redacted]w[redacted]d who presents w/ report of constant lower back pain that began when she woke up this am, then cramping and pelvic pressure as the day went on after she went to work. Reports good fm, denies vb or lof.  Next appt w/ Dr. Aviva Signs in ~4wks.  Reports increased urinary frequency, denies dysuria, urgency, hesitancy, fever/chills. Denies  abnormal vaginal d/c.  Nothing in vagina in last 24hours.   OB History   Grav Para Term Preterm Abortions TAB SAB Ect Mult Living   3 2 2  0 0 0 0 0 0 2      Past Medical History  Diagnosis Date  . GERD (gastroesophageal reflux disease)   . Medical history non-contributory     Past Surgical History  Procedure Laterality Date  . No past surgeries      Family History  Problem Relation Age of Onset  . Alcohol abuse Neg Hx     History  Substance Use Topics  . Smoking status: Never Smoker   . Smokeless tobacco: Not on file  . Alcohol Use: No    Allergies: No Known Allergies  Prescriptions prior to admission  Medication Sig Dispense Refill  . acetaminophen (TYLENOL) 325 MG tablet Take 650 mg by mouth every 6 (six) hours as needed for pain.      . Prenatal Vit-Fe Fumarate-FA (PRENATAL MULTIVITAMIN) TABS Take 1 tablet by mouth daily at 12 noon.      . triamcinolone (KENALOG) 0.025 % ointment Apply topically 2 (two) times daily.  30 g  0  . cetirizine (ZYRTEC) 10 MG tablet Take 1 tablet (10 mg total) by mouth daily as needed for allergies. Prn for itching  30 tablet  0    Review of Systems  Constitutional: Negative.   HENT: Negative.   Eyes: Negative.   Respiratory: Negative.   Cardiovascular: Negative.   Gastrointestinal: Positive for abdominal pain (pelvic pressure and cramping). Negative for nausea and  vomiting.  Genitourinary: Positive for frequency. Negative for dysuria, urgency, hematuria and flank pain.  Musculoskeletal: Positive for back pain (constant lower back pain).  Skin: Negative.   Neurological: Negative.   Endo/Heme/Allergies: Negative.   Psychiatric/Behavioral: Negative.    Physical Exam   Blood pressure 95/49, pulse 75, temperature 97.8 F (36.6 C), temperature source Oral, resp. rate 18, height 5\' 2"  (1.575 m), weight 71.487 kg (157 lb 9.6 oz), last menstrual period 07/18/2012, SpO2 100.00%.  Physical Exam  Constitutional: She is oriented to person, place, and time. She appears well-developed and well-nourished.  HENT:  Head: Normocephalic.  Neck: Normal range of motion.  Cardiovascular: Normal rate.   Respiratory: Effort normal.  GI: Soft. There is no tenderness.  Gravid, FH @ u   Genitourinary:  Spec exam: mod-large amount thick white non-odorous d/c. FFN, wet prep, gc/ch obtained  SVE: ft/th/-3  Musculoskeletal: Normal range of motion.  Neurological: She is alert and oriented to person, place, and time.  Skin: Skin is warm and dry.  Psychiatric: She has a normal mood and affect. Her behavior is normal. Judgment and thought content normal.    FHR: 135, mod variability, 10x10accels, no decels=Cat I UCs: none  MAU Course  Procedures  EFM UA  Spec  exam w/ fFN, wet prep, gc/ch SVE  Results for orders placed during the hospital encounter of 02/07/13 (from the past 24 hour(s))  URINALYSIS, ROUTINE W REFLEX MICROSCOPIC     Status: Abnormal   Collection Time    02/07/13 10:37 PM      Result Value Range   Color, Urine YELLOW  YELLOW   APPearance CLEAR  CLEAR   Specific Gravity, Urine 1.025  1.005 - 1.030   pH 6.0  5.0 - 8.0   Glucose, UA NEGATIVE  NEGATIVE mg/dL   Hgb urine dipstick NEGATIVE  NEGATIVE   Bilirubin Urine NEGATIVE  NEGATIVE   Ketones, ur NEGATIVE  NEGATIVE mg/dL   Protein, ur NEGATIVE  NEGATIVE mg/dL   Urobilinogen, UA 0.2  0.0 - 1.0  mg/dL   Nitrite NEGATIVE  NEGATIVE   Leukocytes, UA SMALL (*) NEGATIVE  URINE MICROSCOPIC-ADD ON     Status: Abnormal   Collection Time    02/07/13 10:37 PM      Result Value Range   Squamous Epithelial / LPF MANY (*) RARE   WBC, UA 7-10  <3 WBC/hpf   RBC / HPF 0-2  <3 RBC/hpf   Bacteria, UA RARE  RARE  FETAL FIBRONECTIN     Status: None   Collection Time    02/07/13 11:34 PM      Result Value Range   Fetal Fibronectin NEGATIVE  NEGATIVE  WET PREP, GENITAL     Status: Abnormal   Collection Time    02/07/13 11:49 PM      Result Value Range   Yeast Wet Prep HPF POC NONE SEEN  NONE SEEN   Trich, Wet Prep NONE SEEN  NONE SEEN   Clue Cells Wet Prep HPF POC NONE SEEN  NONE SEEN   WBC, Wet Prep HPF POC MANY (*) NONE SEEN    Assessment and Plan  A:  [redacted]w[redacted]d SIUP  G3P2002   Cat I FHR  Pelvic pain and LBP during pregnancy   P:  D/C home  Keep appt as scheduled w/ Dr. Aviva Signs in ~4wks  Reviewed ptl s/s,fm  Discussed reasons to return to [facility]  Increase po fluids and rest    Marge Duncans 02/07/2013, 11:24 PM

## 2013-02-08 DIAGNOSIS — M545 Low back pain, unspecified: Secondary | ICD-10-CM

## 2013-02-08 DIAGNOSIS — O99891 Other specified diseases and conditions complicating pregnancy: Secondary | ICD-10-CM

## 2013-02-08 DIAGNOSIS — R109 Unspecified abdominal pain: Secondary | ICD-10-CM

## 2013-02-08 LAB — WET PREP, GENITAL: Yeast Wet Prep HPF POC: NONE SEEN

## 2013-02-09 LAB — GC/CHLAMYDIA PROBE AMP: CT Probe RNA: NEGATIVE

## 2013-02-09 NOTE — Progress Notes (Signed)
Chart reviewed, and I agree with Dr Willaim Rayas plan of care as documented.  JB

## 2013-03-02 ENCOUNTER — Telehealth: Payer: Self-pay | Admitting: Family Medicine

## 2013-03-02 NOTE — Telephone Encounter (Signed)
Patient is requesting that we fax a copy of her pregnancy test to [place] at [facility]. [phone]. She is applying for medicaid. JW

## 2013-03-09 ENCOUNTER — Ambulatory Visit (INDEPENDENT_AMBULATORY_CARE_PROVIDER_SITE_OTHER): Payer: No Typology Code available for payment source | Admitting: Family Medicine

## 2013-03-09 VITALS — BP 101/59 | Temp 98.1°F | Wt 157.0 lb

## 2013-03-09 DIAGNOSIS — Z331 Pregnant state, incidental: Secondary | ICD-10-CM

## 2013-03-09 DIAGNOSIS — Z3403 Encounter for supervision of normal first pregnancy, third trimester: Secondary | ICD-10-CM

## 2013-03-09 DIAGNOSIS — Z3490 Encounter for supervision of normal pregnancy, unspecified, unspecified trimester: Secondary | ICD-10-CM

## 2013-03-09 DIAGNOSIS — Z23 Encounter for immunization: Secondary | ICD-10-CM

## 2013-03-09 DIAGNOSIS — Z3400 Encounter for supervision of normal first pregnancy, unspecified trimester: Secondary | ICD-10-CM

## 2013-03-09 LAB — GLUCOSE, CAPILLARY
Comment 1: 1
Glucose-Capillary: 135 mg/dL — ABNORMAL HIGH (ref 70–99)

## 2013-03-09 LAB — CBC
MCV: 94 fL (ref 78.0–100.0)
Platelets: 227 10*3/uL (ref 150–400)
RBC: 3.34 MIL/uL — ABNORMAL LOW (ref 3.87–5.11)
WBC: 7.9 10*3/uL (ref 4.0–10.5)

## 2013-03-09 LAB — RPR

## 2013-03-09 MED ORDER — TETANUS-DIPHTH-ACELL PERTUSSIS 5-2.5-18.5 LF-MCG/0.5 IM SUSP: 0.5000 mL | Freq: Once | INTRAMUSCULAR | Status: DC

## 2013-03-09 NOTE — Patient Instructions (Addendum)
Prevencin de Sport and exercise psychologist (Preventing Preterm Labor) Un parto prematuro ocurre cuando la mujer embarazada tiene contracciones uterinas que causan la apertura, el acortamiento y el afinamiento del cuello del tero, antes de las 37 semanas de [place]. Tendr contracciones regulares cada 2 a 3 minutos. Esto generalmente causa molestias o dolor. CUIDADOS EN EL HOGAR  Consuma una dieta saludable.  [employer] las vitaminas segn le haya indicado el mdico.  Beba una cantidad de lquido suficiente como para Pharmacologist la orina de tono claro o color amarillo plido todos [place].  Descanse y duerma.  No tenga relaciones sexuales si tiene un parto prematuro o alto riesgo de tenerlo.  Siga las instrucciones del mdico acerca de su [place], los medicamentos y los exmenes.  Evite el estrs.  Evite los esfuerzos extenuantes o la actividad fsica extensa.  No fume. SOLICITE AYUDA DE INMEDIATO SI:   Tiene contracciones.  Siente dolor abdominal.  Tiene sangrado que proviene de la vagina.  Siente dolor al [employer].  Observa una secrecin anormal que proviene de la vagina.  Tiene una temperatura oral de ms de 38,9 C (102 F). ASEGRESE DE QUE:  Comprende estas instrucciones.  Controlar su enfermedad.  Solicitar ayuda si no mejora o si empeora. Document Released: 09/22/2010 Document Revised: 11/12/2011 [employer] Patient Information 2014 [place], [place].  por favor haga una cita en 2 semanas.

## 2013-03-09 NOTE — Progress Notes (Signed)
Rachael Jackson is a 27 y.o. G3P2002 at [redacted]w[redacted]d for routine follow up.  She reports intermittent painful contractions that have slowed down since her last evaluation at MAU a month ago . Pt denies vaginal discharge or bleeding.  See flow sheet for details.   A/P: Pregnancy at [redacted]w[redacted]d.  Doing well.   Pregnancy issues include intermittent painful contractions. She was evaluated recently on MAU for this and discharge home since no   Infant feeding choice: breast feeding Contraception choice: nexplanon  Tdap was given today. 3rd trimester labs drawn today including 1hGTT (RPR, HIV, CBC) Baby on vertex position per [facility] and confirmed with bedside ultrasound.  Preterm labor precautions reviewed.  Safe sleep discussed. Kick counts reviewed. Follow up 2 weeks.

## 2013-03-12 ENCOUNTER — Other Ambulatory Visit (INDEPENDENT_AMBULATORY_CARE_PROVIDER_SITE_OTHER): Payer: No Typology Code available for payment source

## 2013-03-12 DIAGNOSIS — Z331 Pregnant state, incidental: Secondary | ICD-10-CM

## 2013-03-12 NOTE — Progress Notes (Signed)
3 HR GTT DONE TODAY Niambi Smoak 

## 2013-03-13 LAB — GLUCOSE TOLERANCE, 3 HOURS: Glucose Tolerance, 1 hour: 174 mg/dL (ref 70–189)

## 2013-03-24 ENCOUNTER — Ambulatory Visit (INDEPENDENT_AMBULATORY_CARE_PROVIDER_SITE_OTHER): Payer: No Typology Code available for payment source | Admitting: Family Medicine

## 2013-03-24 VITALS — BP 98/53 | Temp 98.2°F | Wt 160.4 lb

## 2013-03-24 DIAGNOSIS — Z3490 Encounter for supervision of normal pregnancy, unspecified, unspecified trimester: Secondary | ICD-10-CM

## 2013-03-24 DIAGNOSIS — Z331 Pregnant state, incidental: Secondary | ICD-10-CM

## 2013-03-24 NOTE — Patient Instructions (Signed)
Ha sido un placer verle hoy. - Por favor tome las medicinas como se le han recetado. - cuente cuanto se mueve el bebe. - haga su proxima cita en 2 semanas  Prevencin de Sport and exercise psychologist (Preventing Preterm Labor) Un parto prematuro ocurre cuando la mujer embarazada tiene contracciones uterinas que causan la apertura, el acortamiento y el afinamiento del cuello del tero, antes de las 37 semanas de [place]. Tendr contracciones regulares cada 2 a 3 minutos. Esto generalmente causa molestias o dolor. CUIDADOS EN EL HOGAR  Consuma una dieta saludable.  [employer] las vitaminas segn le haya indicado el mdico.  Beba una cantidad de lquido suficiente como para Pharmacologist la orina de tono claro o color amarillo plido todos [place].  Descanse y duerma.  No tenga relaciones sexuales si tiene un parto prematuro o alto riesgo de tenerlo.  Siga las instrucciones del mdico acerca de su [place], los medicamentos y los exmenes.  Evite el estrs.  Evite los esfuerzos extenuantes o la actividad fsica extensa.  No fume. SOLICITE AYUDA DE INMEDIATO SI:   Tiene contracciones.  Siente dolor abdominal.  Tiene sangrado que proviene de la vagina.  Siente dolor al [employer].  Observa una secrecin anormal que proviene de la vagina.  Tiene una temperatura oral de ms de 38,9 C (102 F). ASEGRESE DE QUE:  Comprende estas instrucciones.  Controlar su enfermedad.  Solicitar ayuda si no mejora o si empeora. Document Released: 09/22/2010 Document Revised: 11/12/2011 [facility] Patient Information 2014 [place], [place].

## 2013-03-24 NOTE — Progress Notes (Signed)
Rachael Jackson is a 27 y.o. G3P2002 at [redacted]w[redacted]d for routine follow up.  She reports no complaints.  See flow sheet for details.  A/P: Pregnancy at [redacted]w[redacted]d.  Doing well.   Pregnancy issues include: 1h GTT was elevated. 3h GTT only one positive result. No Gestational Diabetes dx.  Tdap was given 03/09/13 . 3rd trimester labs reviewed. Only positive for mild anemia.  GBS/GC/CZ testing was not performed today. Will get sample at 34-36 weeks visit.  Preterm labor precautions reviewed. Continue prenatal tab with iron supplementation.  Dietary recommendations given.  Safe sleep discussed. Kick counts reviewed. Follow up 2 weeks.

## 2013-04-03 ENCOUNTER — Inpatient Hospital Stay (HOSPITAL_COMMUNITY)
Admission: AD | Admit: 2013-04-03 | Discharge: 2013-04-03 | Disposition: A | Discharge status: Home / Self Care | Payer: No Typology Code available for payment source | Source: Ambulatory Visit | Attending: Obstetrics & Gynecology | Admitting: Obstetrics & Gynecology

## 2013-04-03 ENCOUNTER — Telehealth: Payer: Self-pay | Admitting: Family Medicine

## 2013-04-03 ENCOUNTER — Encounter (HOSPITAL_COMMUNITY): Payer: Self-pay | Admitting: *Deleted

## 2013-04-03 DIAGNOSIS — O99891 Other specified diseases and conditions complicating pregnancy: Secondary | ICD-10-CM | POA: Insufficient documentation

## 2013-04-03 DIAGNOSIS — N949 Unspecified condition associated with female genital organs and menstrual cycle: Secondary | ICD-10-CM

## 2013-04-03 DIAGNOSIS — R109 Unspecified abdominal pain: Secondary | ICD-10-CM | POA: Insufficient documentation

## 2013-04-03 LAB — WET PREP, GENITAL
Clue Cells Wet Prep HPF POC: NONE SEEN
Trich, Wet Prep: NONE SEEN

## 2013-04-03 LAB — OB RESULTS CONSOLE GC/CHLAMYDIA
Chlamydia: NEGATIVE
Gonorrhea: NEGATIVE

## 2013-04-03 LAB — URINALYSIS, ROUTINE W REFLEX MICROSCOPIC
Ketones, ur: NEGATIVE mg/dL
Nitrite: NEGATIVE
Protein, ur: NEGATIVE mg/dL

## 2013-04-03 NOTE — Telephone Encounter (Signed)
Pt reports cramping for 3 days after walking or being up for a while - pt states that today and cramps are worse and stronger and no better with drinking water. Has never experienced this with other children. Recommended that pt go to [facility] for further evaluation since it has not gotten better with rest or increased fluids and pains are stronger. Pt verbalized understanding. Wyatt Haste, RN-BSN

## 2013-04-03 NOTE — MAU Provider Note (Signed)
History     CSN: [id number]  Arrival date and time: 04/03/13 1327   None     Chief Complaint  Patient presents with  . Abdominal Pain   Abdominal Pain Associated symptoms include frequency and headaches (headache yesterday resolved). Pertinent negatives include no constipation, diarrhea, dysuria, fever, hematuria, nausea or vomiting.   Rachael Jackson is a 27 y.o. G3P2002 at [redacted]w[redacted]d presents to triage for evaluation of lower bilateral abdominal pain. Started 3d ago and has been slowly worsening since onset. No previous similar episodes and now is 7/10 pain and worse with movement. Pt states she is otherwise doing well.  OB History   Grav Para Term Preterm Abortions TAB SAB Ect Mult Living   3 2 2  0 0 0 0 0 0 2      Past Medical History  Diagnosis Date  . GERD (gastroesophageal reflux disease)   . Medical history non-contributory     Past Surgical History  Procedure Laterality Date  . No past surgeries      Family History  Problem Relation Age of Onset  . Alcohol abuse Neg Hx     History  Substance Use Topics  . Smoking status: Never Smoker   . Smokeless tobacco: Not on file  . Alcohol Use: No    Allergies: No Known Allergies  Prescriptions prior to admission  Medication Sig Dispense Refill  . Prenatal Vit-Fe Fumarate-FA (PRENATAL MULTIVITAMIN) TABS Take 1 tablet by mouth daily at 12 noon.       Neg LOF, VB, CTX, +FM Review of Systems  Constitutional: Negative.  Negative for fever and chills.  Eyes: Negative for blurred vision, double vision and pain.  Respiratory: Negative for cough and hemoptysis.   Cardiovascular: Negative for chest pain and palpitations.  Gastrointestinal: Positive for abdominal pain. Negative for nausea, vomiting, diarrhea and constipation.  Genitourinary: Positive for frequency. Negative for dysuria, urgency, hematuria and flank pain.  Musculoskeletal: Positive for back pain (at baseline). Negative for joint pain and falls.   Skin: Negative for rash.  Neurological: Positive for dizziness (occassional dizziness) and headaches (headache yesterday resolved).   Physical Exam   Blood pressure 122/52, pulse 79, temperature 97.5 F (36.4 C), resp. rate 18, height 5\' 2"  (1.575 m), weight 160 lb 9.6 oz (72.848 kg), last menstrual period 07/18/2012.  Physical Exam  Nursing note and vitals reviewed. Constitutional: She is oriented to person, place, and time. She appears well-developed and well-nourished.  HENT:  Head: Normocephalic and atraumatic.  Cardiovascular: Normal rate, regular rhythm, normal heart sounds and intact distal pulses.  Exam reveals no gallop and no friction rub.   No murmur heard. Respiratory: Breath sounds normal. No respiratory distress. She has no wheezes. She has no rales. She exhibits no tenderness.  GI: Soft. Bowel sounds are normal. She exhibits no distension and no mass. There is tenderness (tender in Bilateral lower quadrants). There is no rebound and no guarding.  Genitourinary: Uterus normal. Pelvic exam was performed with patient supine. No labial fusion. There is no rash, tenderness, lesion or injury on the right labia. There is no rash, tenderness, lesion or injury on the left labia. No erythema, tenderness or bleeding around the vagina. No foreign body around the vagina. No signs of injury around the vagina. Vaginal discharge (copious white fluid.) found.  Musculoskeletal: Normal range of motion. She exhibits no edema and no tenderness.  Neurological: She is alert and oriented to person, place, and time. No cranial nerve deficit.   Ferning negative  Results for Rachael Jackson, Rachael Jackson (MRN [id number]) as of 04/03/2013 15:27  Ref. Range 04/03/2013 13:35  Color, Urine Latest Range: YELLOW  STRAW (A)  APPearance Latest Range: Jackson  Jackson  Specific Gravity, Urine Latest Range: 1.005-1.030  1.010  pH Latest Range: 5.0-8.0  7.0  Glucose Latest Range: NEGATIVE mg/dL NEGATIVE  Bilirubin Urine  Latest Range: NEGATIVE  NEGATIVE  Ketones, ur Latest Range: NEGATIVE mg/dL NEGATIVE  Protein Latest Range: NEGATIVE mg/dL NEGATIVE  Urobilinogen, UA Latest Range: 0.0-1.0 mg/dL 0.2  Nitrite Latest Range: NEGATIVE  NEGATIVE  Leukocytes, UA Latest Range: NEGATIVE  TRACE (A)  Hgb urine dipstick Latest Range: NEGATIVE  NEGATIVE  WBC, UA Latest Range: <3 WBC/hpf 0-2  Squamous Epithelial / LPF Latest Range: RARE  RARE  Bacteria, UA Latest Range: RARE  RARE  Results for Rachael Jackson, Rachael Jackson (MRN [id number]) as of 04/03/2013 15:27  Ref. Range 04/03/2013 14:40  Yeast Wet Prep HPF POC Latest Range: NONE SEEN  NONE SEEN  Trich, Wet Prep Latest Range: NONE SEEN  NONE SEEN  Clue Cells Wet Prep HPF POC Latest Range: NONE SEEN  NONE SEEN  WBC, Wet Prep HPF POC Latest Range: NONE SEEN  MANY (A)   FHT: 130s mod variabilty. Multiple accels, no decels Toco: no ctx   MAU Course  Procedures  MDM Evaluated pt for BV, UTI and GC/C. Also evaluated for ferning which was negative. Without PTL sx, did not evaluate for labor at this visit.  Assessment and Plan  Pain likely related to round ligament pain. No evidence of active infection at this time. UA + for LE and should be followed  Up culture for possible treatment. No symptoms of labor. Pt given PTL Precautions and may return as needed.  NWG:NFAO   Rachael Jackson, Rachael Jackson 04/03/2013, 3:15 PM

## 2013-04-03 NOTE — MAU Note (Signed)
Patient states she has been having lower abdominal cramping for 3 days but becoming more consistent. Denies bleeding or leaking and reports good fetal movement.

## 2013-04-03 NOTE — Telephone Encounter (Signed)
Pt is calling because she is having cramping. She has had this for a couple of days now. She said today it is worse. She is due in September and I asked her did she think she was in labor and she didn't think so but wasn't sure. She is wanting advice on what she should do. JW

## 2013-04-04 LAB — GC/CHLAMYDIA PROBE AMP: CT Probe RNA: NEGATIVE

## 2013-04-09 ENCOUNTER — Ambulatory Visit (INDEPENDENT_AMBULATORY_CARE_PROVIDER_SITE_OTHER): Payer: No Typology Code available for payment source | Admitting: Family Medicine

## 2013-04-09 VITALS — BP 103/60 | Temp 97.9°F | Wt 159.0 lb

## 2013-04-09 DIAGNOSIS — Z331 Pregnant state, incidental: Secondary | ICD-10-CM

## 2013-04-09 DIAGNOSIS — Z3490 Encounter for supervision of normal pregnancy, unspecified, unspecified trimester: Secondary | ICD-10-CM

## 2013-04-09 NOTE — Patient Instructions (Addendum)
Prevencin de Sport and exercise psychologist (Preventing Preterm Labor) Un parto prematuro ocurre cuando la mujer embarazada tiene contracciones uterinas que causan la apertura, el acortamiento y el afinamiento del cuello del tero, antes de las 37 semanas de [place]. Tendr contracciones regulares cada 2 a 3 minutos. Esto generalmente causa molestias o dolor. CUIDADOS EN EL HOGAR  Consuma una dieta saludable.  [employer] las vitaminas segn le haya indicado el mdico.  Beba una cantidad de lquido suficiente como para Pharmacologist la orina de tono claro o color amarillo plido todos [place].  Descanse y duerma.  No tenga relaciones sexuales si tiene un parto prematuro o alto riesgo de tenerlo.  Siga las instrucciones del mdico acerca de su [place], los medicamentos y los exmenes.  Evite el estrs.  Evite los esfuerzos extenuantes o la actividad fsica extensa.  No fume. SOLICITE AYUDA DE INMEDIATO SI:   Tiene contracciones.  Siente dolor abdominal.  Tiene sangrado que proviene de la vagina.  Siente dolor al [employer].  Observa una secrecin anormal que proviene de la vagina.  Tiene una temperatura oral de ms de 38,9 C (102 F). ASEGRESE DE QUE:  Comprende estas instrucciones.  Controlar su enfermedad.  Solicitar ayuda si no mejora o si empeora. Document Released: 09/22/2010 Document Revised: 11/12/2011 [facility] Patient Information 2014 [place], [place].

## 2013-04-09 NOTE — Progress Notes (Signed)
Rachael Jackson is a 27 y.o. G3P2002 at [redacted]w[redacted]d for routine follow up.  She reports.  See flow sheet for details.  A/P: Pregnancy at [redacted]w[redacted]d.  Doing well.  Recently was evaluated in MAU for abdominal pain most likely due to round ligament. No Urine Cultures to f/u. Pt denies flank pain, fever, chills, dysuria or frequency.   Infant feeding choice breastfeeding Contraception choice nexplanon Today GBS has been obtained. GC and CT wre negatibe on 04/03/13. No need to repeat.  Vertex Presentation verified with beside ultrasound.  Preterm labor precautions reviewed. Safe sleep discussed. Kick counts reviewed. If urinary symptoms appear pt instructed to make a f/u sooner.  Follow up 2 weeks.

## 2013-04-23 ENCOUNTER — Ambulatory Visit (INDEPENDENT_AMBULATORY_CARE_PROVIDER_SITE_OTHER): Payer: No Typology Code available for payment source | Admitting: Family Medicine

## 2013-04-23 VITALS — BP 103/56 | Wt 160.0 lb

## 2013-04-23 DIAGNOSIS — Z3480 Encounter for supervision of other normal pregnancy, unspecified trimester: Secondary | ICD-10-CM

## 2013-04-23 NOTE — Patient Instructions (Addendum)
It was good to see you today.  Everything is going well.  You are dilated to 1 cm.  If you start feeling more regular contractions, not feeling the baby move, any bleeding, loss of fluids, worsening headache or changes in vision that don't go away, make sure you come back or go to the hospital.

## 2013-04-24 NOTE — Progress Notes (Signed)
Rachael Jackson is a 27 y.o. G3P2002 at [redacted]w[redacted]d for routine follow up.  She reports only some mild headache without vision changes.  Irregular contractions as above in flowsheet.  See flow sheet for details.  A/P: Pregnancy at [redacted]w[redacted]d.  Doing well.   Minimally dilated, soft cervix, minimally effaced on cervical examination today.    Infant feeding choice breastfeeding Contraception choice nexplanon Today GBS has been obtained. GC and CT wre negatibe on 04/03/13. No need to repeat.  Vertex Presentation verified with beside ultrasound.  Preterm labor precautions reviewed. Safe sleep discussed. Kick counts reviewed. See instructions for verbal and written prenatal precautions.  GBS already done and negative. Follow up 1 weeks.

## 2013-04-30 ENCOUNTER — Ambulatory Visit (INDEPENDENT_AMBULATORY_CARE_PROVIDER_SITE_OTHER): Payer: No Typology Code available for payment source | Admitting: Family Medicine

## 2013-04-30 VITALS — BP 90/52 | Temp 97.9°F | Wt 161.0 lb

## 2013-04-30 DIAGNOSIS — Z23 Encounter for immunization: Secondary | ICD-10-CM

## 2013-04-30 DIAGNOSIS — Z331 Pregnant state, incidental: Secondary | ICD-10-CM

## 2013-04-30 DIAGNOSIS — Z3490 Encounter for supervision of normal pregnancy, unspecified, unspecified trimester: Secondary | ICD-10-CM

## 2013-04-30 NOTE — Patient Instructions (Addendum)
Ha sido un placer verle hoy. - Por favor tome las tabletas prenatales como se le han recetado. - Continue contando los movimientos fetales. - Haga su proxima cita en 1 semana.

## 2013-04-30 NOTE — Progress Notes (Signed)
Rachael Jackson is a 27 y.o. G3P2002 at [redacted]w[redacted]d for routine follow up.  She has no current complaints. Denies vaginal bleeding or leakage. No abdominal pain, headaches or vision changes.   See flow sheet for details.  A/P: Pregnancy at [redacted]w[redacted]d.  Doing well.   Pregnancy issues include none  Infant feeding choice breastfeeding Contraception choice nexplanon GBS negative Flu vaccine administered today. Labor precautions reviewed. Kick counts reviewed. Next visit in a week. At 40 wks NST and BPP At 41 wks inducction.

## 2013-05-03 ENCOUNTER — Encounter (HOSPITAL_COMMUNITY): Payer: Self-pay | Admitting: *Deleted

## 2013-05-03 ENCOUNTER — Inpatient Hospital Stay (HOSPITAL_COMMUNITY)
Admission: AD | Admit: 2013-05-03 | Discharge: 2013-05-03 | Disposition: A | Discharge status: Home / Self Care | Payer: Self-pay | Source: Ambulatory Visit | Attending: Obstetrics & Gynecology | Admitting: Obstetrics & Gynecology

## 2013-05-03 DIAGNOSIS — R6883 Chills (without fever): Secondary | ICD-10-CM | POA: Insufficient documentation

## 2013-05-03 DIAGNOSIS — R51 Headache: Secondary | ICD-10-CM | POA: Insufficient documentation

## 2013-05-03 DIAGNOSIS — O212 Late vomiting of pregnancy: Secondary | ICD-10-CM | POA: Insufficient documentation

## 2013-05-03 DIAGNOSIS — O479 False labor, unspecified: Secondary | ICD-10-CM | POA: Insufficient documentation

## 2013-05-03 NOTE — MAU Note (Signed)
Pt reports having ctx since Friday. Ctx stronger and c/o headache and chills and nauseated.

## 2013-05-07 ENCOUNTER — Ambulatory Visit (INDEPENDENT_AMBULATORY_CARE_PROVIDER_SITE_OTHER): Payer: No Typology Code available for payment source | Admitting: Family Medicine

## 2013-05-07 VITALS — BP 90/54 | Temp 98.1°F | Wt 161.0 lb

## 2013-05-07 DIAGNOSIS — Z3490 Encounter for supervision of normal pregnancy, unspecified, unspecified trimester: Secondary | ICD-10-CM

## 2013-05-07 DIAGNOSIS — Z3480 Encounter for supervision of other normal pregnancy, unspecified trimester: Secondary | ICD-10-CM

## 2013-05-07 NOTE — Progress Notes (Signed)
Rachael Jackson is a 27 y.o. G3P2002 at [redacted]w[redacted]d for routine follow up.  She reports some irregular painful contractions. Denies vaginal bleeding or leakage.  Went to MAU last Sunday and was 4.5/605 effaced. Today not much change.  See flow sheet for details.  A/P: Pregnancy at [redacted]w[redacted]d.  Doing well.   BPP and NST will be scheduled at 40 wks and Induction at 41wks Labor precautions reviewed. Kick counts reviewed.

## 2013-05-07 NOTE — Patient Instructions (Addendum)
Haz una cita en 1 semana. Ve al hospital de la mujer si tienes contracciones dolorosas, ruptura de la fuente o sangramiento vaginal.

## 2013-05-15 ENCOUNTER — Ambulatory Visit (INDEPENDENT_AMBULATORY_CARE_PROVIDER_SITE_OTHER): Payer: Medicaid Other | Admitting: Family Medicine

## 2013-05-15 ENCOUNTER — Encounter (HOSPITAL_COMMUNITY): Payer: Self-pay | Admitting: *Deleted

## 2013-05-15 ENCOUNTER — Inpatient Hospital Stay (HOSPITAL_COMMUNITY)
Admission: AD | Admit: 2013-05-15 | Discharge: 2013-05-17 | DRG: 775 | Disposition: A | Discharge status: Home / Self Care | Payer: Medicaid Other | Source: Ambulatory Visit | Attending: Obstetrics & Gynecology | Admitting: Obstetrics & Gynecology

## 2013-05-15 VITALS — BP 113/64 | Wt 160.0 lb

## 2013-05-15 DIAGNOSIS — Z331 Pregnant state, incidental: Secondary | ICD-10-CM

## 2013-05-15 DIAGNOSIS — Z3490 Encounter for supervision of normal pregnancy, unspecified, unspecified trimester: Secondary | ICD-10-CM

## 2013-05-15 LAB — CBC
MCH: 30.9 pg (ref 26.0–34.0)
MCHC: 33.3 g/dL (ref 30.0–36.0)
MCV: 92.8 fL (ref 78.0–100.0)
Platelets: 194 10*3/uL (ref 150–400)
RBC: 3.59 MIL/uL — ABNORMAL LOW (ref 3.87–5.11)

## 2013-05-15 LAB — TYPE AND SCREEN

## 2013-05-15 MED ORDER — SENNOSIDES-DOCUSATE SODIUM 8.6-50 MG PO TABS
2.0000 | ORAL_TABLET | ORAL | Status: DC
  Administered 2013-05-16: 2 via ORAL

## 2013-05-15 MED ORDER — ONDANSETRON HCL 4 MG PO TABS: 4.0000 mg | ORAL_TABLET | ORAL | Status: DC | PRN

## 2013-05-15 MED ORDER — ONDANSETRON HCL 4 MG/2ML IJ SOLN: 4.0000 mg | Freq: Four times a day (QID) | INTRAMUSCULAR | Status: DC | PRN

## 2013-05-15 MED ORDER — BENZOCAINE-MENTHOL 20-0.5 % EX AERO
1.0000 "application " | INHALATION_SPRAY | CUTANEOUS | Status: DC | PRN
  Filled 2013-05-15: qty 56

## 2013-05-15 MED ORDER — LANOLIN HYDROUS EX OINT: TOPICAL_OINTMENT | CUTANEOUS | Status: DC | PRN

## 2013-05-15 MED ORDER — FENTANYL CITRATE 0.05 MG/ML IJ SOLN: 50.0000 ug | INTRAMUSCULAR | Status: DC | PRN

## 2013-05-15 MED ORDER — ONDANSETRON HCL 4 MG/2ML IJ SOLN: 4.0000 mg | INTRAMUSCULAR | Status: DC | PRN

## 2013-05-15 MED ORDER — IBUPROFEN 600 MG PO TABS
600.0000 mg | ORAL_TABLET | Freq: Four times a day (QID) | ORAL | Status: DC | PRN
  Administered 2013-05-15: 600 mg via ORAL
  Filled 2013-05-15: qty 1

## 2013-05-15 MED ORDER — OXYCODONE-ACETAMINOPHEN 5-325 MG PO TABS
1.0000 | ORAL_TABLET | ORAL | Status: DC | PRN
  Administered 2013-05-15: 1 via ORAL
  Filled 2013-05-15: qty 1

## 2013-05-15 MED ORDER — CITRIC ACID-SODIUM CITRATE 334-500 MG/5ML PO SOLN: 30.0000 mL | ORAL | Status: DC | PRN

## 2013-05-15 MED ORDER — SIMETHICONE 80 MG PO CHEW: 80.0000 mg | CHEWABLE_TABLET | ORAL | Status: DC | PRN

## 2013-05-15 MED ORDER — IBUPROFEN 600 MG PO TABS
600.0000 mg | ORAL_TABLET | Freq: Four times a day (QID) | ORAL | Status: DC
  Administered 2013-05-16 – 2013-05-17 (×6): 600 mg via ORAL
  Filled 2013-05-15 (×6): qty 1

## 2013-05-15 MED ORDER — OXYTOCIN 40 UNITS IN LACTATED RINGERS INFUSION - SIMPLE MED: 62.5000 mL/h | INTRAVENOUS | Status: DC

## 2013-05-15 MED ORDER — LACTATED RINGERS IV SOLN
INTRAVENOUS | Status: DC
  Administered 2013-05-15 (×2): via INTRAVENOUS

## 2013-05-15 MED ORDER — PRENATAL MULTIVITAMIN CH
1.0000 | ORAL_TABLET | Freq: Every day | ORAL | Status: DC
  Administered 2013-05-16 – 2013-05-17 (×2): 1 via ORAL
  Filled 2013-05-15 (×2): qty 1

## 2013-05-15 MED ORDER — ZOLPIDEM TARTRATE 5 MG PO TABS: 5.0000 mg | ORAL_TABLET | Freq: Every evening | ORAL | Status: DC | PRN

## 2013-05-15 MED ORDER — OXYTOCIN 40 UNITS IN LACTATED RINGERS INFUSION - SIMPLE MED
1.0000 m[IU]/min | INTRAVENOUS | Status: DC
  Administered 2013-05-15: 2 m[IU]/min via INTRAVENOUS
  Filled 2013-05-15: qty 1000

## 2013-05-15 MED ORDER — TETANUS-DIPHTH-ACELL PERTUSSIS 5-2.5-18.5 LF-MCG/0.5 IM SUSP
0.5000 mL | Freq: Once | INTRAMUSCULAR | Status: DC
  Filled 2013-05-15: qty 0.5

## 2013-05-15 MED ORDER — OXYCODONE-ACETAMINOPHEN 5-325 MG PO TABS
1.0000 | ORAL_TABLET | ORAL | Status: DC | PRN
  Administered 2013-05-16: 2 via ORAL
  Filled 2013-05-15: qty 2

## 2013-05-15 MED ORDER — LIDOCAINE HCL (PF) 1 % IJ SOLN
30.0000 mL | INTRAMUSCULAR | Status: DC | PRN
  Filled 2013-05-15: qty 30

## 2013-05-15 MED ORDER — DIBUCAINE 1 % RE OINT
1.0000 "application " | TOPICAL_OINTMENT | RECTAL | Status: DC | PRN
  Filled 2013-05-15: qty 28

## 2013-05-15 MED ORDER — WITCH HAZEL-GLYCERIN EX PADS: 1.0000 "application " | MEDICATED_PAD | CUTANEOUS | Status: DC | PRN

## 2013-05-15 MED ORDER — DIPHENHYDRAMINE HCL 25 MG PO CAPS: 25.0000 mg | ORAL_CAPSULE | Freq: Four times a day (QID) | ORAL | Status: DC | PRN

## 2013-05-15 MED ORDER — ACETAMINOPHEN 325 MG PO TABS: 650.0000 mg | ORAL_TABLET | ORAL | Status: DC | PRN

## 2013-05-15 MED ORDER — OXYTOCIN BOLUS FROM INFUSION: 500.0000 mL | INTRAVENOUS | Status: DC

## 2013-05-15 MED ORDER — TERBUTALINE SULFATE 1 MG/ML IJ SOLN: 0.2500 mg | Freq: Once | INTRAMUSCULAR | Status: DC | PRN

## 2013-05-15 MED ORDER — LACTATED RINGERS IV SOLN: 500.0000 mL | INTRAVENOUS | Status: DC | PRN

## 2013-05-15 NOTE — Progress Notes (Signed)
Post Partum Day 1 Subjective: no complaints, up ad lib, voiding, tolerating PO and + flatus  Objective: Blood pressure 83/43, pulse 73, temperature 98.2 F (36.8 C), temperature source Oral, resp. rate 20, height 5\' 2"  (1.575 m), weight 72.938 kg (160 lb 12.8 oz), last menstrual period 07/18/2012, SpO2 95.00%.  Physical Exam:  General: alert, cooperative and no distress Lochia: appropriate Uterine Fundus: firm Incision: n/a DVT Evaluation: No evidence of DVT seen on physical exam.   Recent Labs  05/15/13 1240 05/16/13 0613  HGB 11.1* 8.8*  HCT 33.3* 26.2*   Assessment/Plan: Breastfeeding and Contraception not sure yet. Husband is going to have Vasectomy.    LOS: 1 day   Rachael Jackson, Rachael Jackson 05/16/2013, 8:56 AM

## 2013-05-15 NOTE — H&P (Signed)
Rachael Jackson is a 27 y.o. female G42P2002 @[redacted]w[redacted]d  presenting for labor evaluation. Cx has been 4-5 cm x 2 wks. C/O increased UC pain since last night.   PN course: essentially uncomplicated except late to care at [facility]  Maternal Medical History:  Reason for admission: Contractions.  Nausea.  Contractions: Onset was 13-24 hours ago.   Frequency: irregular.   Perceived severity is moderate.    Fetal activity: Perceived fetal activity is normal.   Last perceived fetal movement was within the past hour.    Prenatal complications: No bleeding.   Prenatal Complications - Diabetes: none. 1 abnl value on 3hr OGTT  OB History   Grav Para Term Preterm Abortions TAB SAB Ect Mult Living   3 2 2  0 0 0 0 0 0 2     Past Medical History  Diagnosis Date  . GERD (gastroesophageal reflux disease)   . Medical history non-contributory    Past Surgical History  Procedure Laterality Date  . No past surgeries     Family History: family history is negative for Alcohol abuse. Social History:  reports that she has never smoked. She does not have any smokeless tobacco history on file. She reports that she does not drink alcohol or use illicit drugs.   Prenatal Transfer Tool  Maternal Diabetes: No Genetic Screening: Declined Maternal Ultrasounds/Referrals: Normal Fetal Ultrasounds or other Referrals:  None Maternal Substance Abuse:  No Significant Maternal Medications:  None Significant Maternal Lab Results:  None Other Comments:  GBS neg  Review of Systems  Constitutional: Negative for fever.  Respiratory: Negative for cough.   Gastrointestinal: Negative for nausea and vomiting.  Genitourinary: Negative for dysuria.  Neurological: Negative for headaches.    Dilation: 5 Effacement (%): 60 Station: -2;-3 Exam by:: D. Wilder Kurowski, FNP Blood pressure 118/57, pulse 70, temperature 97.3 F (36.3 C), temperature source Oral, resp. rate 20, height 5\' 2"  (1.575 m), weight 160 lb 12.8 oz (72.938  kg), last menstrual period 07/18/2012. Maternal Exam:  Uterine Assessment: Contraction strength is mild.  Abdomen: Patient reports no abdominal tenderness. Estimated fetal weight is 7#.   Fetal presentation: vertex  Introitus: Normal vulva. Normal vagina.  Ferning test: not done.  Nitrazine test: not done. Amniotic fluid character: not assessed.  Pelvis: adequate for delivery.   Cervix: Cervix evaluated by digital exam.     Fetal Exam Fetal Monitor Review: Mode: ultrasound.   Baseline rate: 125.  Variability: moderate (6-25 bpm).   Pattern: accelerations present and no decelerations.    Fetal State Assessment: Category I - tracings are normal.     Physical Exam  Constitutional: She is oriented to person, place, and time. She appears well-developed and well-nourished.  HENT:  Head: Normocephalic.  Eyes: Pupils are equal, round, and reactive to light.  Neck: Normal range of motion.  Cardiovascular: Normal rate.   Respiratory: Effort normal and breath sounds normal.  GI: Soft.  Musculoskeletal: Normal range of motion.  Neurological: She is alert and oriented to person, place, and time.  Skin: Skin is warm and dry.  Psychiatric: She has a normal mood and affect. Her behavior is normal. Judgment and thought content normal.  Dilation: 5 Effacement (%): 60 Cervical Position: Posterior Station: -2;-3 Presentation: Vertex Exam by:: Bernita Buffy, FNP  Prenatal labs: ABO, Rh: O/POS/-- (04/03 1058) Antibody: NEG (04/03 1058) Rubella: 2.34 (04/03 1058) RPR: NON REAC (07/07 0921)  HBsAg: NEGATIVE (04/03 1058)  HIV: NON REACTIVE (07/07 0921)  GBS: NEGATIVE (08/07 1622)  3 hr:  [phone]  Assessment/Plan: Multip at 40 wks early labor, hx fast labors> pitocin augmentation Dr. Aviva Signs PMD will come for the birth   [name] 05/15/2013, 12:58 PM

## 2013-05-15 NOTE — MAU Provider Note (Signed)
SUBJECTIVE: 27yo G3P2002 at 40.1wks sent from [facility] for labor evaluation. Cx has been 4-5 cm for 2 wks. Reports increased UCs x 1d. Hx fast labor, 30 min. OBJECTIVE: Filed Vitals:   05/15/13 1207  BP: 102/56  Pulse: 75  Temp: 97.9 F (36.6 C)  Resp: 20  Cx 5/70/-2 FHR Category 1  ASSESSMENT:PLAN:  D/W Dr. Debroah Loop will admit for early labor. Attempted to reach PMD Dr. Aviva Signs, message left at [facility].  Danae Orleans, CNM 05/15/2013 12:19 PM

## 2013-05-15 NOTE — Patient Instructions (Addendum)
Yo he [employer] de la Mujer para que seas evaluada en el MAU. Por favor presentate en esta unidad tan pronto come puedas.

## 2013-05-15 NOTE — Progress Notes (Signed)
Rachael Jackson is a 27 y.o. G3P2002 at [redacted]w[redacted]d for routine follow up.  She reports intermittent contractions. No vaginal bleeding, leakage, headaches, abdominal pain or vision changes.    See flow sheet for details.  A/P: Pregnancy at [redacted]w[redacted]d.  Doing well.   On her pelvic exam I have noticed descended station but mostly on anterior aspect, cervix still posterior. Fetus is vertex but I am concerned about abnormal presentation.   Since there has been changes on her cervix we recommend labor check at [facility]. I have spoken with Rodman Pickle, CNM in order to coordinate care.

## 2013-05-15 NOTE — MAU Note (Signed)
Dilated 5 cm's for last 2 weeks, ongoing cramping that is getting worse.  Denies bleeding or discharge.  Decreased FM

## 2013-05-15 NOTE — Discharge Summary (Signed)
Obstetric Discharge Summary Reason for Admission: onset of labor Prenatal Procedures: ultrasound Intrapartum Procedures: spontaneous vaginal delivery Postpartum Procedures: none Complications-Operative and Postpartum: none Hemoglobin  Date Value Range Status  05/16/2013 8.8* 12.0 - 15.0 g/dL Final     DELTA CHECK NOTED     REPEATED TO VERIFY     HCT  Date Value Range Status  05/16/2013 26.2* 36.0 - 46.0 % Final    Physical Exam:  General: alert, cooperative and no distress Lochia: appropriate Uterine Fundus: firm Incision: n/a DVT Evaluation: No evidence of DVT seen on physical exam. Negative Homan's sign.  Discharge Diagnoses: Term Pregnancy-delivered  Discharge Information: Date: 05/15/2013 Activity: unrestricted Diet: routine Medications: Ibuprofen Condition: stable Instructions: refer to practice specific booklet Discharge to: home   Newborn Data: Live born female  Birth Weight: 7 lb 12 oz (3515 g) APGAR: 9, 9  Home with mother.  PILOTO, DAYARMYS 05/17/2013, 10:21 AM  I have seen and examined this patient and agree with above documentation in the resident's note. Pt is doing well postpartum. Breast feeding. Desires nexplanon for contraception.   Rulon Abide, M.D. [facility] Fellow 05/17/2013 10:53 AM

## 2013-05-15 NOTE — Progress Notes (Signed)
Patient ID: Misa Fedorko, female   DOB: December 10, 1985, 27 y.o.   MRN: [id number] Marisela Line is a 27 y.o. G3P2002 at [redacted]w[redacted]d admitted for labor  Subjective: Ucs stronger.   Objective: BP 94/49  Pulse 79  Temp(Src) 98.4 F (36.9 C) (Oral)  Resp 16  Ht 5\' 2"  (1.575 m)  Wt 160 lb 12.8 oz (72.938 kg)  BMI 29.4 kg/m2  LMP 07/18/2012  Fetal Heart FHR: 140 bpm, variability: moderate,  accelerations:  Present,  decelerations:  Absent   Contractions: q3-4, pitocin at 24 mu  SVE:   Dilation: 5 Effacement (%): 70 Station: -2 Exam by:: Bernita Buffy, CNM SROM> clear  Assessment / Plan:  Labor: early Fetal Wellbeing: Category1 Pain Control:  Coping well Expected mode of delivery: NSVD  Murriel Eidem 05/15/2013, 7:35 PM

## 2013-05-16 ENCOUNTER — Encounter (HOSPITAL_COMMUNITY): Payer: Self-pay | Admitting: *Deleted

## 2013-05-16 LAB — CBC
HCT: 26.2 % — ABNORMAL LOW (ref 36.0–46.0)
Hemoglobin: 8.8 g/dL — ABNORMAL LOW (ref 12.0–15.0)
MCH: 31.3 pg (ref 26.0–34.0)
MCHC: 33.6 g/dL (ref 30.0–36.0)
MCV: 93.2 fL (ref 78.0–100.0)
Platelets: 167 10*3/uL (ref 150–400)
RBC: 2.81 MIL/uL — ABNORMAL LOW (ref 3.87–5.11)
RDW: 13.8 % (ref 11.5–15.5)
WBC: 10.1 10*3/uL (ref 4.0–10.5)

## 2013-05-16 NOTE — Progress Notes (Signed)
I spoke with and examined patient and agree with resident's note and plan of care.  Anticipate D/C tomorrow. Cheral Marker, CNM, [facility] 05/16/2013 4:58 PM

## 2013-05-17 MED ORDER — IBUPROFEN 600 MG PO TABS: 600.0000 mg | ORAL_TABLET | Freq: Four times a day (QID) | ORAL | Status: DC

## 2013-05-17 NOTE — Lactation Note (Signed)
This note was copied from the chart of [name]. Lactation Consultation Note  Patient Name: [name] ZOXWR'U Date: 05/17/2013 Reason for consult: Follow-up assessment Baby is tongue thrusting and not maintaining latch on the breast after a few sucks. Suck training taught to mother to assist baby with latching. Nipple shied was changed to #20 after found to be more effective. Mother is an expressed breastfeeding mother and is able to acknowledge when the latch is appropriate. Mother's milk is increasing in volume, no nipple tenderness noted and mother demonstrates ability to work with baby. Mother has a hand pump and can express milk. Taught how to finger feed and supplement with her own milk if needed. Lactation follow up appointment on 05/21/2013.  Maternal Data    Feeding Feeding Type: Breast Milk Length of feed: 15 min  LATCH Score/Interventions Latch: Repeated attempts needed to sustain latch, nipple held in mouth throughout feeding, stimulation needed to elicit sucking reflex. Intervention(s): Skin to skin  Audible Swallowing: Spontaneous and intermittent Intervention(s): Skin to skin  Type of Nipple: Everted at rest and after stimulation  Comfort (Breast/Nipple): Soft / non-tender     Hold (Positioning): Assistance needed to correctly position infant at breast and maintain latch.  LATCH Score: 8  Lactation Tools Discussed/Used Tools: Nipple Shields Nipple shield size: 20 (switched to #20 nipple shield) Pump Review: Setup, frequency, and cleaning;Milk Storage Initiated by:: BDaly Date initiated:: 05/17/13   Consult Status Consult Status: Complete Follow-up type: Out-patient    Christella Hartigan M 05/17/2013, 12:33 PM

## 2013-05-19 NOTE — Progress Notes (Signed)
Post discharge chart review completed.  

## 2013-05-21 ENCOUNTER — Ambulatory Visit (HOSPITAL_COMMUNITY): Payer: Medicaid Other

## 2013-08-19 ENCOUNTER — Ambulatory Visit: Payer: No Typology Code available for payment source | Admitting: Sports Medicine

## 2014-03-08 ENCOUNTER — Encounter (HOSPITAL_COMMUNITY): Payer: Self-pay | Admitting: Emergency Medicine

## 2014-03-08 ENCOUNTER — Emergency Department (HOSPITAL_COMMUNITY)
Admission: EM | Admit: 2014-03-08 | Discharge: 2014-03-08 | Disposition: A | Discharge status: Home / Self Care | Payer: Self-pay | Attending: Emergency Medicine | Admitting: Emergency Medicine

## 2014-03-08 DIAGNOSIS — Z8719 Personal history of other diseases of the digestive system: Secondary | ICD-10-CM | POA: Insufficient documentation

## 2014-03-08 DIAGNOSIS — H5789 Other specified disorders of eye and adnexa: Secondary | ICD-10-CM | POA: Insufficient documentation

## 2014-03-08 DIAGNOSIS — H02843 Edema of right eye, unspecified eyelid: Secondary | ICD-10-CM

## 2014-03-08 MED ORDER — DIPHENHYDRAMINE HCL 25 MG PO TABS: 25.0000 mg | ORAL_TABLET | Freq: Four times a day (QID) | ORAL | Status: DC

## 2014-03-08 MED ORDER — TRAMADOL HCL 50 MG PO TABS: 50.0000 mg | ORAL_TABLET | Freq: Four times a day (QID) | ORAL | Status: DC | PRN

## 2014-03-08 MED ORDER — OXYCODONE-ACETAMINOPHEN 5-325 MG PO TABS
1.0000 | ORAL_TABLET | Freq: Once | ORAL | Status: AC
  Administered 2014-03-08: 1 via ORAL
  Filled 2014-03-08: qty 1

## 2014-03-08 MED ORDER — CEPHALEXIN 500 MG PO CAPS: 500.0000 mg | ORAL_CAPSULE | Freq: Four times a day (QID) | ORAL | Status: DC

## 2014-03-08 NOTE — ED Provider Notes (Signed)
CSN: [id number]     Arrival date & time 03/08/14  1255 History  This chart was scribed for non-physician practitioner Renne CriglerJoshua Lamarius Dirr working with Enid SkeensJoshua M Zavitz, MD by Carl Bestelina Holson, ED Scribe. This patient was seen in room TR04C/TR04C and the patient's care was started at 2:32 PM.    Chief Complaint  Patient presents with  . Facial Swelling    HPI Comments: Rachael Jackson is a 28 y.o. female who presents to the Emergency Department complaining of constant swelling with associated pain to her right upper eyelid that started this morning when the patient first woke up.  She states that there is no pain under her eye.  The patient states that she has experienced these symptoms 2-3 times in the past but it has never been this severe in terms of swelling or pain.  She denies having seen a physician in the past for her symptoms.  She lists blurred vision and headache as associated symptoms.  She denies tearing of her right eye as an associated symptom.  She denies having come in contact with someone with pink eye recently.  The patient denies being allergic to any medications.  She denies having a PCP.  The history is provided by the patient. No language interpreter was used.    Past Medical History  Diagnosis Date  . GERD (gastroesophageal reflux disease)   . Medical history non-contributory    Past Surgical History  Procedure Laterality Date  . No past surgeries     Family History  Problem Relation Age of Onset  . Alcohol abuse Neg Hx    History  Substance Use Topics  . Smoking status: Never Smoker   . Smokeless tobacco: Not on file  . Alcohol Use: No   OB History   Grav Para Term Preterm Abortions TAB SAB Ect Mult Living   3 3 3  0 0 0 0 0 0 3     Review of Systems  Constitutional: Negative for fever.  HENT: Positive for facial swelling. Negative for rhinorrhea and sore throat.   Eyes: Positive for pain and visual disturbance. Negative for photophobia, discharge, redness and  itching.  Respiratory: Negative for cough.   Cardiovascular: Negative for chest pain.  Gastrointestinal: Negative for nausea, vomiting, abdominal pain and diarrhea.  Genitourinary: Negative for dysuria.  Musculoskeletal: Negative for myalgias.  Skin: Negative for rash.  Neurological: Positive for headaches.      Allergies  Review of patient's allergies indicates no known allergies.  Home Medications   Prior to Admission medications   Not on File   Triage Vitals: BP 112/59  Pulse 84  Temp(Src) 98 F (36.7 C) (Oral)  Resp 16  Ht 5\' 2"  (1.575 m)  Wt 141 lb 3.2 oz (64.048 kg)  BMI 25.82 kg/m2  SpO2 97%  Physical Exam  Nursing note and vitals reviewed. Constitutional: She appears well-developed and well-nourished.  HENT:  Head: Normocephalic and atraumatic.  Nose: Nose normal.  Mouth/Throat: Oropharynx is clear and moist.  Soft tissue swelling of right upper eyelid with moderate tenderness. There is no overlying erythema or warmth. There is no periorbital swelling or redness. No pain with movement of the. Pupils equal round and reactive to light.  Eyes: Conjunctivae and EOM are normal. Pupils are equal, round, and reactive to light. Right eye exhibits no discharge. Left eye exhibits no discharge.  No scleral injection.  Neck: Normal range of motion. Neck supple.  Cardiovascular: Normal rate.   Pulmonary/Chest: Effort normal. No respiratory distress.  Musculoskeletal: Normal range of motion.  Neurological: She is alert.  Skin: Skin is warm and dry.  Psychiatric: She has a normal mood and affect. Her behavior is normal.    ED Course  Procedures (including critical care time)  DIAGNOSTIC STUDIES: Oxygen Saturation is 97% on room air, adequate by my interpretation.    COORDINATION OF CARE: 2:33 PM- Discussed a clinical suspicion of cellulitis with the patient and discharging the patient with eye drops and an antibiotic.  Advised the patient to take Benadryl in the event  that she is having an allergic reaction and apply warm compresses to her right eye.  The patient agreed to the treatment plan.   Labs Review Labs Reviewed - No data to display  Imaging Review No results found.   EKG Interpretation None       Vital signs reviewed and are as follows: Filed Vitals:   03/08/14 1309  BP: 112/59  Pulse: 84  Temp: 98 F (36.7 C)  Resp: 16   Patient with swelling of the upper eyelid. Will treat with Benadryl in case this is allergic, Keflex in case this is an early infection. There is no current signs of orbital cellulitis. No signs of periorbital cellulitis. The area does not feel like or have the appearance of a stye.   Patient counseled to do warm compresses at home as well.  She is to return in 24-48 hours if not improved.  MDM   Final diagnoses:  Swelling of eyelid, right   Swelling as above.  I personally performed the services described in this documentation, which was scribed in my presence. The recorded information has been reviewed and is accurate.    Renne CriglerJoshua Jamera Vanloan, PA-C 03/08/14 1446

## 2014-03-08 NOTE — ED Notes (Signed)
Pt instructed to avoid Ultram while breastfeeding as per PA.

## 2014-03-08 NOTE — ED Notes (Signed)
Pt asking if the Rx's given are safe for breast feeding. Will ask PA.

## 2014-03-08 NOTE — ED Provider Notes (Signed)
Medical screening examination/treatment/procedure(s) were performed by non-physician practitioner and as supervising physician I was immediately available for consultation/collaboration.   EKG Interpretation None        Rachael Hanover M Teofilo Lupinacci, MD 03/08/14 1533 

## 2014-03-08 NOTE — Discharge Instructions (Signed)
Please read and follow all provided instructions.  Your diagnoses today include:  1. Swelling of eyelid, right     Tests performed today include:  Vital signs. See below for your results today.   Medications prescribed:   Benadryl (diphenhydramine) - antihistamine  You can find this medication over-the-counter.   DO NOT exceed:   50mg  Benadryl every 6 hours    Benadryl will make you drowsy. DO NOT drive or perform any activities that require you to be awake and alert if taking this.   Keflex (cephalexin) - antibiotic  You have been prescribed an antibiotic medicine: take the entire course of medicine even if you are feeling better. Stopping early can cause the antibiotic not to work.   Tramadol - narcotic-like pain medication  DO NOT drive or perform any activities that require you to be awake and alert because this medicine can make you drowsy.   Take any prescribed medications only as directed.  Home care instructions:  Follow any educational materials contained in this packet. Follow-up care is necessary to be sure the infection is healing if not completely resolved in 2-3 days.    Follow-up instructions: Please follow-up with your primary care doctor in the next 2-3 days for further evaluation of your symptoms.  Return instructions:   Please return to the Emergency Department if you experience worsening symptoms.   Please return immediately if you develop severe pain, pus drainage, new change in vision, or fever.  Please return if you have any other emergent concerns.  Additional Information:  Your vital signs today were: BP 112/59   Pulse 84   Temp(Src) 98 F (36.7 C) (Oral)   Resp 16   Ht 5\' 2"  (1.575 m)   Wt 141 lb 3.2 oz (64.048 kg)   BMI 25.82 kg/m2   SpO2 97% If your blood pressure (BP) was elevated above 135/85 this visit, please have this repeated by your doctor within one month. ---------------

## 2014-03-08 NOTE — ED Notes (Signed)
Pt states she woke this am with R eyelid swelling and pain in her R eye. States her vision is blurry out of the eye because of the swelling.

## 2014-07-05 ENCOUNTER — Encounter (HOSPITAL_COMMUNITY): Payer: Self-pay | Admitting: Emergency Medicine

## 2015-03-13 ENCOUNTER — Emergency Department (HOSPITAL_COMMUNITY)
Admission: EM | Admit: 2015-03-13 | Discharge: 2015-03-14 | Disposition: A | Discharge status: Home / Self Care | Payer: Self-pay | Attending: Emergency Medicine | Admitting: Emergency Medicine

## 2015-03-13 ENCOUNTER — Emergency Department (HOSPITAL_COMMUNITY): Payer: Self-pay

## 2015-03-13 ENCOUNTER — Encounter (HOSPITAL_COMMUNITY): Payer: Self-pay | Admitting: *Deleted

## 2015-03-13 DIAGNOSIS — R519 Headache, unspecified: Secondary | ICD-10-CM

## 2015-03-13 DIAGNOSIS — R079 Chest pain, unspecified: Secondary | ICD-10-CM | POA: Insufficient documentation

## 2015-03-13 DIAGNOSIS — R531 Weakness: Secondary | ICD-10-CM | POA: Insufficient documentation

## 2015-03-13 DIAGNOSIS — R42 Dizziness and giddiness: Secondary | ICD-10-CM | POA: Insufficient documentation

## 2015-03-13 DIAGNOSIS — R51 Headache: Secondary | ICD-10-CM | POA: Insufficient documentation

## 2015-03-13 DIAGNOSIS — Z8719 Personal history of other diseases of the digestive system: Secondary | ICD-10-CM | POA: Insufficient documentation

## 2015-03-13 DIAGNOSIS — Z3202 Encounter for pregnancy test, result negative: Secondary | ICD-10-CM | POA: Insufficient documentation

## 2015-03-13 DIAGNOSIS — R202 Paresthesia of skin: Secondary | ICD-10-CM | POA: Insufficient documentation

## 2015-03-13 LAB — BASIC METABOLIC PANEL
Anion gap: 10 (ref 5–15)
BUN: 18 mg/dL (ref 6–20)
CO2: 18 mmol/L — ABNORMAL LOW (ref 22–32)
Calcium: 9.5 mg/dL (ref 8.9–10.3)
Chloride: 110 mmol/L (ref 101–111)
Creatinine, Ser: 0.74 mg/dL (ref 0.44–1.00)
GFR calc Af Amer: >60 mL/min (ref >60)
GFR calc non Af Amer: >60 mL/min (ref >60)
GLUCOSE: 144 mg/dL — AB (ref 65–99)
POTASSIUM: 3.6 mmol/L (ref 3.5–5.1)
Sodium: 138 mmol/L (ref 135–145)

## 2015-03-13 LAB — CBC
HEMATOCRIT: 36.4 % (ref 36.0–46.0)
Hemoglobin: 12.5 g/dL (ref 12.0–15.0)
MCH: 31.7 pg (ref 26.0–34.0)
MCHC: 34.3 g/dL (ref 30.0–36.0)
MCV: 92.4 fL (ref 78.0–100.0)
Platelets: 212 10*3/uL (ref 150–400)
RBC: 3.94 MIL/uL (ref 3.87–5.11)
RDW: 12.4 % (ref 11.5–15.5)
WBC: 5.6 10*3/uL (ref 4.0–10.5)

## 2015-03-13 LAB — POC URINE PREG, ED: Preg Test, Ur: NEGATIVE

## 2015-03-13 LAB — I-STAT TROPONIN, ED: TROPONIN I, POC: 0 ng/mL (ref 0.00–0.08)

## 2015-03-13 MED ORDER — ONDANSETRON HCL 4 MG PO TABS: 4.0000 mg | ORAL_TABLET | Freq: Four times a day (QID) | ORAL | Status: DC

## 2015-03-13 MED ORDER — DIPHENHYDRAMINE HCL 50 MG/ML IJ SOLN
25.0000 mg | Freq: Once | INTRAMUSCULAR | Status: AC
  Administered 2015-03-13: 25 mg via INTRAVENOUS
  Filled 2015-03-13: qty 1

## 2015-03-13 MED ORDER — MECLIZINE HCL 25 MG PO TABS
25.0000 mg | ORAL_TABLET | Freq: Once | ORAL | Status: AC
  Administered 2015-03-13: 25 mg via ORAL
  Filled 2015-03-13: qty 1

## 2015-03-13 MED ORDER — SODIUM CHLORIDE 0.9 % IV BOLUS (SEPSIS)
1000.0000 mL | Freq: Once | INTRAVENOUS | Status: AC
  Administered 2015-03-13: 1000 mL via INTRAVENOUS

## 2015-03-13 MED ORDER — SODIUM CHLORIDE 0.9 % IV BOLUS (SEPSIS)
1000.0000 mL | Freq: Once | INTRAVENOUS | Status: AC
Start: 2015-03-13 — End: 2015-03-14
  Administered 2015-03-13: 1000 mL via INTRAVENOUS

## 2015-03-13 MED ORDER — KETOROLAC TROMETHAMINE 30 MG/ML IJ SOLN
30.0000 mg | Freq: Once | INTRAMUSCULAR | Status: AC
  Administered 2015-03-13: 30 mg via INTRAVENOUS
  Filled 2015-03-13: qty 1

## 2015-03-13 MED ORDER — DEXAMETHASONE SODIUM PHOSPHATE 10 MG/ML IJ SOLN
10.0000 mg | Freq: Once | INTRAMUSCULAR | Status: AC
  Administered 2015-03-13: 10 mg via INTRAVENOUS
  Filled 2015-03-13: qty 1

## 2015-03-13 MED ORDER — ACETAMINOPHEN 325 MG PO TABS
650.0000 mg | ORAL_TABLET | Freq: Once | ORAL | Status: AC
  Administered 2015-03-13: 650 mg via ORAL
  Filled 2015-03-13: qty 2

## 2015-03-13 MED ORDER — MECLIZINE HCL 25 MG PO TABS: 25.0000 mg | ORAL_TABLET | Freq: Four times a day (QID) | ORAL | Status: DC

## 2015-03-13 NOTE — ED Notes (Signed)
Used translator phones, pt reports feeling weak all over her body and lightheaded. Has tingling sensation to both hands and feet. Pt hyperventilating on arrival, informed pt to take slow deep breaths. Also reports having chest pains. ekg done at triage.

## 2015-03-13 NOTE — ED Provider Notes (Signed)
CSN: [id number]     Arrival date & time 03/13/15  1720 History   First MD Initiated Contact with Patient 03/13/15 1939     Chief Complaint  Patient presents with  . Dizziness  . Chest Pain     (Consider location/radiation/quality/duration/timing/severity/associated sxs/prior Treatment) HPI   PCP: De Hollingshead, DO Blood pressure 108/73, pulse 63, resp. rate 19, last menstrual period 02/27/2015, SpO2 99 %, unknown if currently breastfeeding.  Rachael Jackson is a 29 y.o.female without any significant PMH presents to the ER with complaints of GERD.  The patient presents to the emergency department with complaints of dizziness, headache, feeling weak all over her body, lightheadedness, hyperventilating, tingling to both her hands and feet. She reports in triage having chest pains that the patient denies ever having any chest pains to me. She reports standing outside for a while in the heat today while she was waiting for something, she started to feel lightheaded and as though she may pass out and went inside to sit down and rest. She has improvement with rest but anytime she gets up to moves she feels dizzy as though she may fall over. No CP, shortness of breath, lower extremity swelling. On my initial examination the patient is no longer hyperventilating and feeling the pins and needles to her extremities. She is having headache and some mild dizziness.  The patient denies diaphoresis, fever, headache, weakness (general or focal), confusion, change of vision,  neck pain, dysphagia, aphagia, chest pain, shortness of breath,  back pain, abdominal pains, nausea, vomiting, diarrhea, lower extremity swelling, rash.   Past Medical History  Diagnosis Date  . GERD (gastroesophageal reflux disease)   . Medical history non-contributory    Past Surgical History  Procedure Laterality Date  . No past surgeries     Family History  Problem Relation Age of Onset  . Alcohol abuse Neg Hx     History  Substance Use Topics  . Smoking status: Never Smoker   . Smokeless tobacco: Not on file  . Alcohol Use: No   OB History    Gravida Para Term Preterm AB TAB SAB Ectopic Multiple Living   0 0 0 0 0 0 3     Review of Systems  10 Systems reviewed and are negative for acute change except as noted in the HPI.     Allergies  Review of patient's allergies indicates no known allergies.  Home Medications   Prior to Admission medications   Medication Sig Start Date End Date Taking? Authorizing Provider  Multiple Vitamin (MULTIVITAMIN WITH MINERALS) TABS tablet Take 1 tablet by mouth as needed (only takes sometimes).   Yes Historical Provider, MD  cephALEXin (KEFLEX) 500 MG capsule Take 1 capsule (500 mg total) by mouth 4 (four) times daily. Patient not taking: Reported on 03/13/2015 03/08/14   Renne Crigler, PA-C  diphenhydrAMINE (BENADRYL) 25 MG tablet Take 1 tablet (25 mg total) by mouth every 6 (six) hours. Patient not taking: Reported on 03/13/2015 03/08/14   Renne Crigler, PA-C  meclizine (ANTIVERT) 25 MG tablet Take 1 tablet (25 mg total) by mouth 4 (four) times daily. 03/13/15   Willman Cuny Neva Seat, PA-C  traMADol (ULTRAM) 50 MG tablet Take 1 tablet (50 mg total) by mouth every 6 (six) hours as needed. Patient not taking: Reported on 03/13/2015 03/08/14   Renne Crigler, PA-C   BP 108/73 mmHg  Pulse 63  Temp(Src)   Resp 19  SpO2 99%  LMP 02/27/2015 Physical Exam  Constitutional: She is oriented to person, place, and time. She appears well-developed and well-nourished. No distress.  HENT:  Head: Normocephalic and atraumatic.  Eyes: Pupils are equal, round, and reactive to light.  Neck: Normal range of motion. Neck supple. No spinous process tenderness and no muscular tenderness present.  Cardiovascular: Normal rate and regular rhythm.   Pulmonary/Chest: Effort normal. No respiratory distress. She has no wheezes. She has no rales.  Abdominal: Soft. She exhibits no  distension. There is no tenderness. There is no rebound.  Neurological: She is alert and oriented to person, place, and time.  Cranial nerves II-VIII and X-XII evaluated and show no deficits. Pt alert and oriented x 3 Upper and lower extremity strength is symmetrical and physiologic Normal muscular tone No facial droop Coordination intact, no limb ataxia,Rapid alternating movements normal No pronator drift  Skin: Skin is warm and dry.  Nursing note and vitals reviewed.   ED Course  Procedures (including critical care time) Labs Review Labs Reviewed  BASIC METABOLIC PANEL - Abnormal; Notable for the following:    CO2 18 (*)    Glucose, Bld 144 (*)    All other components within normal limits  CBC  I-STAT TROPOININ, ED  POC URINE PREG, ED    Imaging Review Dg Chest 2 View  03/13/2015   CLINICAL DATA:  29 year old female with chest pain  EXAM: CHEST  2 VIEW  COMPARISON:  None.  FINDINGS: The heart size and mediastinal contours are within normal limits. Both lungs are clear. The visualized skeletal structures are unremarkable.  IMPRESSION: No active cardiopulmonary disease.   Electronically Signed   By: Elgie CollardArash  Radparvar M.D.   On: 03/13/2015 18:50     EKG Interpretation   Date/Time:  Sunday March 13 2015 17:28:34 EDT Ventricular Rate:  70 PR Interval:  156 QRS Duration: 84 QT Interval:  428 QTC Calculation: 462 R Axis:   63 Text Interpretation:  Normal sinus rhythm Normal ECG Confirmed by ALLEN   MD, ANTHONY ([id number]) on 03/13/2015 8:36:36 PM      MDM   Final diagnoses:  Vertigo  Nonintractable headache, unspecified chronicity pattern, unspecified headache type    The PA was monitored in the ED without any adverse events. Her symptoms resolved with migraine cocktail and Antivert.  Presentation is non concerning for [facility] IncAH, ICH, Meningitis, or temporal arteritis. Pt is afebrile with no focal neuro deficits, nuchal rigidity, or change in vision. The patient denies any  symptoms of neurological impairment or TIA's; no amaurosis, diplopia, dysphasia, or unilateral disturbance of motor or sensory function.    The patients them started while she was in the sun, question dehydration. Patient was not orthostatic here in the ER but still given 2 L of fluids. No positive neurological findings. After the patient's headache resolved I ambulated her down hallway to the bathroom she tolerated ambulating well without any difficulties, dizziness or headache. The patient is young and healthy without any risk factors for TIA.  Considered PE- she is PERC negative Has heart score of zero.  Pt given rx for Antivert and advised to f/u with her PCP.  Medications  sodium chloride 0.9 % bolus 1,000 mL (0 mLs Intravenous Stopped 03/13/15 2238)  meclizine (ANTIVERT) tablet 25 mg (25 mg Oral Given 03/13/15 2050)  acetaminophen (TYLENOL) tablet 650 mg (650 mg Oral Given 03/13/15 2050)  ketorolac (TORADOL) 30 MG/ML injection 30 mg (30 mg Intravenous Given 03/13/15 2155)  dexamethasone (DECADRON) injection 10 mg (10 mg Intravenous Given 03/13/15 2201)  diphenhydrAMINE (BENADRYL) injection 25 mg (25 mg Intravenous Given 03/13/15 2151)  sodium chloride 0.9 % bolus 1,000 mL (1,000 mLs Intravenous New Bag/Given 03/13/15 2237)    29 y.o.Rachael Jackson's evaluation in the Emergency Department is complete. It has been determined that no acute conditions requiring further emergency intervention are present at this time. The patient/guardian have been advised of the diagnosis and plan. We have discussed signs and symptoms that warrant return to the ED, such as changes or worsening in symptoms.  Vital signs are stable at discharge. Filed Vitals:   03/13/15 2343  BP: 108/58  Pulse: 56  Resp: 14    Patient/guardian has voiced understanding and agreed to follow-up with the PCP or specialist.   Marlon Pel, PA-C 03/13/15 2346  Lorre Nick, MD 03/15/15 1127

## 2015-03-13 NOTE — ED Notes (Signed)
Pt began to have dizziness and anxiety while RN was given medications.  Monitor began to beep a-fib.  HR became lower than recent readings.  This RN snapped an EKG and provided to PA and MD.

## 2015-03-13 NOTE — ED Notes (Signed)
Pt continues to c/o dizziness and "feeling weak".  Pt sts when she closes her eyes the dizziness resolves some.  Pt readjusted in bed, covered with blanket, and encouraged to sleep with lights down.

## 2015-03-13 NOTE — ED Notes (Signed)
Pt provided with cracker and a ginger ale to observe nausea.  Pt sts she is feeling a lot better and feels she can go home.

## 2015-03-13 NOTE — Discharge Instructions (Signed)
Vrtigo postural benigno (Benign Positional Vertigo)  Vrtigo es la sensacin de que el entorno se mueve estando quieto. Es la forma ms frecuente de vrtigo. Benigno significa que la causa del trastorno no es grave. Es ms frecuente en adultos mayores. CAUSAS  Es el resultado de un trastorno en el sistema laberntico. Es una zona en el odo medio que ayuda a controlar el equilibrio. La causa puede ser una infeccin viral, una lesin en la cabeza o un movimiento repetitivo. Sin embargo, a menudo no se Barrister's clerkhalla causa.  SNTOMAS  Los sntomas de vrtigo posicional benigno se producen al mover la cabeza o los ojos en diferentes direcciones. Algunos de los sntomas pueden ser:   Prdida de equilibrio y cadas.  Vmitos.  Visin borrosa.  Mareos.  Nuseas.  Movimientos oculares involuntarios (nistagmus). DIAGNSTICO  El vrtigo postural benigno se diagnostica mediante un examen fsico. Si la causa especfica de su vrtigo posicional benigno es desconocido, su mdico puede indicar diagnsticos por imgenes, como la Health visitorresonancia magntica (RM) o la tomografa computada (TC).  TRATAMIENTO  El Office Depotmdico le podr recomendar movimientos o procedimientos para corregir el vrtigo posicional benigno. Para tratar los sntomas pueden indicarse medicamentos como meclizina, benzodiazepinas y medicamentos para las nuseas. En casos raros, si los sntomas son causados   por ciertos trastornos que afectan el odo interno, es posible que necesite Azerba[place]iruga.  INSTRUCCIONES PARA EL CUIDADO EN EL HOGAR   Siga las indicaciones del mdico.  Muvase lentamente. No haga movimientos bruscos con la cabeza ni el cuerpo.  Evite conducir vehculos.  Evite operar maquinarias pesadas.  Evite realizar tareas que seran peligrosas para usted u otras personas durante un episodio de vrtigo.  Debe ingerir gran cantidad de lquido para mantener la orina de tono claro o color amarillo plido. SOLICITE ATENCIN MDICA DE INMEDIATO  SI:   Tiene dificultad para hablar, caminar, siente debilidad o tiene problemas para usar los [address] Avenuebrazos, las manos o las piernas.  Tiene dificultad para respirar.  Sufre un dolor de cabeza intenso.  Las nuseas o los vmitos persisten o Center Pointempeoran.  Tiene cambios en la visin.  Sus familiares o amigos notan cambios en su conducta.  El dolor Timblinempeora.  Tiene fiebre.  Comienza a sentir rigidez en el cuello o sensibilidad a la luz. ASEGRESE DE QUE:   Comprende estas instrucciones.  Controlar su enfermedad.  Solicitar ayuda de inmediato si no mejora o si empeora. Document Released: 12/06/2008 Document Revised: 11/12/2011 [facility] Patient Information 2015 [place], [place]. This information is not intended to replace advice given to you by your health care provider. Make sure you discuss any questions you have with your health care provider. Cefalea migraosa (Migraine Headache) Una cefalea migraosa es un dolor intenso y punzante en uno o ambos lados de la cabeza. La migraa puede durar desde 30 minutos hasta varias horas. CAUSAS  No siempre se conoce la causa exacta de la cefalea migraosa. Sin embargo, IT consultantpueden aparecer Circuit Citycuando los nervios del cerebro se irritan y liberan ciertas sustancias qumicas que causan inflamacin. Esto ocasiona dolor. Existen tambin ciertos factores que pueden desencadenar las migraas, como los siguientes:  Alcohol.  Fumar.  Estrs.  La menstruacin  Quesos aejados.  Los alimentos o las bebidas que contienen nitratos, glutamato, aspartamo o tiramina.  Falta de sueo.  Chocolate.  Cafena.  Hambre.  Actividad fsica extenuante.  Fatiga.  Medicamentos que se usan para tratar Aeronautical engineerel dolor en el pecho (nitroglicerina), pldoras anticonceptivas, estrgeno y algunos medicamentos para la hipertensin arterial. Marnee SpringSIGNOS Y SNTOMAS  Dolor en uno o ambos lados de la cabeza.  Dolor pulsante o punzante.  Dolor intenso que impide [employer]  actividades diarias.  Dolor que se agrava por cualquier actividad fsica.  Nuseas, vmitos o ambos.  Mareos.  Dolor con la exposicin a las luces brillantes, a los ruidos fuertes o la [place].  Sensibilidad general a las luces brillantes, a los ruidos fuertes o a los [employer]. Antes de sufrir una migraa, puede recibir seales de advertencia (aura). Un aura puede incluir:  Ver las luces intermitentes.  Ver puntos brillantes, halos o lneas en zigzag.  Tener una visin en tnel o visin borrosa.  Sensacin de entumecimiento u hormigueo.  Dificultad para hablar  Debilidad muscular. DIAGNSTICO  La cefalea migraosa se diagnostica en funcin de lo siguiente:  Sntomas.  Examen fsico.  [name] TC (tomografa computada) o resonancia magntica de la cabeza. Estas pruebas de diagnstico por imagen no pueden diagnosticar las migraas, pero pueden ayudar a Sales promotion account executive otras causas de las cefaleas. TRATAMIENTO Le prescribirn medicamentos para Engineer, materials y las nuseas. Tambin podrn administrarse medicamentos para ayudar a Armed forces training and education officer.  INSTRUCCIONES PARA EL CUIDADO EN EL HOGAR  Slo tome medicamentos de venta libre o recetados para Primary school teacher o Environmental health practitioner, segn las indicaciones de su mdico. No se recomienda usar los opiceos a Air cabin crew.  Cuando tenga la migraa, acustese en un cuarto oscuro y tranquilo  Lleve un registro diario para Financial risk analyst lo que puede provocar las Soil scientist. Por ejemplo, escriba:  Lo que usted come y bebe.  Cunto tiempo duerme.  Algn cambio en su dieta o en los medicamentos.  Limite el consumo de bebidas alcohlicas.  Si fuma, deje de hacerlo.  Duerma entre 7 y 9horas, o segn las recomendaciones del mdico.  Limite el estrs.  Mantenga las luces tenues si le [employer] luces brillantes y la [place]. SOLICITE ATENCIN MDICA DE INMEDIATO SI:   La migraa se hace cada vez ms  intensa.  Tiene fiebre.  Presenta rigidez en el cuello.  Tiene prdida de visin.  Presenta debilidad muscular o prdida del control muscular.  Comienza a perder el equilibrio o tiene problemas para caminar.  Sufre mareos o se desmaya.  Tiene sntomas graves que son diferentes a los primeros sntomas. ASEGRESE DE QUE:   Comprende estas instrucciones.  Controlar su afeccin.  Recibir ayuda de inmediato si no mejora o si empeora. Document Released: 08/20/2005 Document Revised: 06/10/2013 [facility] Patient Information 2015 [place], [place]. This information is not intended to replace advice given to you by your health care provider. Make sure you discuss any questions you have with your health care provider.

## 2015-03-14 NOTE — ED Notes (Signed)
Pt denies any nausea.  Sts she was able to keep ginger ale and crackers down.  Denies any dizziness, sts she is ready to go home and go to bed.

## 2015-03-14 NOTE — ED Notes (Signed)
Pt able to ambulate in hall and change self in room with gait steady and even, denies dizziness.

## 2015-07-04 ENCOUNTER — Encounter (HOSPITAL_COMMUNITY): Payer: Self-pay | Admitting: Emergency Medicine

## 2015-07-04 ENCOUNTER — Emergency Department (HOSPITAL_COMMUNITY)
Admission: EM | Admit: 2015-07-04 | Discharge: 2015-07-04 | Disposition: A | Discharge status: Home / Self Care | Payer: Self-pay | Attending: Emergency Medicine | Admitting: Emergency Medicine

## 2015-07-04 DIAGNOSIS — Z8719 Personal history of other diseases of the digestive system: Secondary | ICD-10-CM | POA: Insufficient documentation

## 2015-07-04 DIAGNOSIS — J069 Acute upper respiratory infection, unspecified: Secondary | ICD-10-CM | POA: Insufficient documentation

## 2015-07-04 DIAGNOSIS — Z79899 Other long term (current) drug therapy: Secondary | ICD-10-CM | POA: Insufficient documentation

## 2015-07-04 LAB — RAPID STREP SCREEN (MED CTR MEBANE ONLY): STREPTOCOCCUS, GROUP A SCREEN (DIRECT): NEGATIVE

## 2015-07-04 MED ORDER — FLUORESCEIN SODIUM 1 MG OP STRP: 1.0000 | ORAL_STRIP | Freq: Once | OPHTHALMIC | Status: DC

## 2015-07-04 MED ORDER — AZITHROMYCIN 250 MG PO TABS: 250.0000 mg | ORAL_TABLET | Freq: Every day | ORAL | Status: DC

## 2015-07-04 NOTE — ED Notes (Addendum)
Pt from home for eval of sore throat and cough x1 week, pt reports now has generalized body aches and headache. Pt states has tried OTC cold medicines with no help. nad noted. White patches noted to right side of throat.

## 2015-07-04 NOTE — ED Provider Notes (Signed)
CSN: [id number]     Arrival date & time 07/04/15  1209 History   First MD Initiated Contact with Patient 07/04/15 1307     Chief Complaint  Patient presents with  . Sore Throat  . Cough   HPI   [name] is a 29 yo F pw 1 week ago sore throat, hoarseness, dry cough. She endorses a fever, frontal sinus pressure, left ear pain, myalgias, decreased appetite. She has tried Theraflu, nyquil, BC powder without relief. No ill contacts, N/V/D.   LMP 3 weeks ago.   Past Medical History  Diagnosis Date  . GERD (gastroesophageal reflux disease)   . Medical history non-contributory    Past Surgical History  Procedure Laterality Date  . No past surgeries     Family History  Problem Relation Age of Onset  . Alcohol abuse Neg Hx    Social History  Substance Use Topics  . Smoking status: Never Smoker   . Smokeless tobacco: None  . Alcohol Use: No   OB History    Gravida Para Term Preterm AB TAB SAB Ectopic Multiple Living   3 3 3  0 0 0 0 0 0 3     Review of Systems  Ten systems are reviewed and are negative for acute change except as noted in the HPI  Allergies  Review of patient's allergies indicates no known allergies.  Home Medications   Prior to Admission medications   Medication Sig Start Date End Date Taking? Authorizing Provider  azithromycin (ZITHROMAX) 250 MG tablet Take 1 tablet (250 mg total) by mouth daily. Take first 2 tablets together, then 1 every day until finished. 07/04/15   Melton KrebsSamantha Nicole Rick Warnick, PA-C  cephALEXin (KEFLEX) 500 MG capsule Take 1 capsule (500 mg total) by mouth 4 (four) times daily. Patient not taking: Reported on 03/13/2015 03/08/14   Renne CriglerJoshua Geiple, PA-C  diphenhydrAMINE (BENADRYL) 25 MG tablet Take 1 tablet (25 mg total) by mouth every 6 (six) hours. Patient not taking: Reported on 03/13/2015 03/08/14   Renne CriglerJoshua Geiple, PA-C  meclizine (ANTIVERT) 25 MG tablet Take 1 tablet (25 mg total) by mouth 4 (four) times daily. 03/13/15   Marlon Peliffany Greene,  PA-C  Multiple Vitamin (MULTIVITAMIN WITH MINERALS) TABS tablet Take 1 tablet by mouth as needed (only takes sometimes).    Historical Provider, MD  ondansetron (ZOFRAN) 4 MG tablet Take 1 tablet (4 mg total) by mouth every 6 (six) hours. 03/13/15   Tiffany Neva SeatGreene, PA-C  traMADol (ULTRAM) 50 MG tablet Take 1 tablet (50 mg total) by mouth every 6 (six) hours as needed. Patient not taking: Reported on 03/13/2015 03/08/14   Renne CriglerJoshua Geiple, PA-C   BP 107/60 mmHg  Pulse 104  Temp(Src) 98.4 F (36.9 C) (Oral)  Resp 24  Ht 5\' 2"  (1.575 m)  Wt 142 lb 1 oz (64.439 kg)  BMI 25.98 kg/m2  SpO2 100%  LMP 06/13/2015 Physical Exam  Constitutional: She appears well-developed and well-nourished. No distress.  HENT:  Head: Normocephalic and atraumatic.  Right Ear: External ear normal.  Left Ear: External ear normal.  Nose: Nose normal.  Mouth/Throat: No oropharyngeal exudate.  Frontal sinus TTP, oropharynx erythematous. Airway patent.  Eyes: Conjunctivae are normal. Pupils are equal, round, and reactive to light. Right eye exhibits no discharge. Left eye exhibits no discharge. No scleral icterus.  Neck: No tracheal deviation present.  Cardiovascular: Normal rate, regular rhythm, normal heart sounds and intact distal pulses.  Exam reveals no gallop and no friction rub.  No murmur heard. Pulmonary/Chest: Effort normal and breath sounds normal. No respiratory distress. She has no wheezes. She has no rales. She exhibits no tenderness.  Abdominal: Soft. Bowel sounds are normal. She exhibits no distension and no mass. There is no tenderness. There is no rebound and no guarding.  Musculoskeletal: Normal range of motion. She exhibits no edema.  Lymphadenopathy:    She has no cervical adenopathy.  Neurological: She is alert. Coordination normal.  Skin: Skin is warm and dry. No rash noted. She is not diaphoretic. No erythema.  Psychiatric: She has a normal mood and affect. Her behavior is normal.  Nursing note  and vitals reviewed.   ED Course  Procedures  Labs Review Labs Reviewed  RAPID STREP SCREEN (NOT AT [facility])  CULTURE, GROUP A STREP     MDM   Final diagnoses:  Upper respiratory infection   Patient appears uncomfortable but no distress. Physical exam unremarkable except for front sinus tenderness and erythematous oropharynx.   Rapid strep was negative.   Although this is most likely a viral etiology, because the patient has tried many OTC remedies without relief, will prescribe z-pack. Told patient to rest, stay hydrated, and discussed reasons for her to return. Advised her to follow-up with her PCP as needed.   Asked patient if she is breastfeeding, and she is. I offered to change her prescription if needed, but she said she would discard breastmilk until her antibiotics are completed, as her baby is 75 years old.  Melton Krebs, PA-C 07/05/15 2247  Eber Hong, MD 07/06/15 [phone]

## 2015-07-04 NOTE — Discharge Instructions (Signed)
Ms. Normajean BaxterCervantesLeal,  Feel better soon. Please take this prescription as directed and follow-up with your primary care provider within one week.   Ortencia KickS. Nicole Lavern Crimi, PA-C

## 2015-07-07 LAB — CULTURE, GROUP A STREP

## 2016-04-22 ENCOUNTER — Emergency Department (HOSPITAL_COMMUNITY)
Admission: EM | Admit: 2016-04-22 | Discharge: 2016-04-22 | Disposition: A | Discharge status: Home / Self Care | Payer: Self-pay | Attending: Emergency Medicine | Admitting: Emergency Medicine

## 2016-04-22 ENCOUNTER — Encounter (HOSPITAL_COMMUNITY): Payer: Self-pay

## 2016-04-22 ENCOUNTER — Emergency Department (HOSPITAL_COMMUNITY): Payer: Self-pay

## 2016-04-22 DIAGNOSIS — Y929 Unspecified place or not applicable: Secondary | ICD-10-CM | POA: Insufficient documentation

## 2016-04-22 DIAGNOSIS — Y939 Activity, unspecified: Secondary | ICD-10-CM | POA: Insufficient documentation

## 2016-04-22 DIAGNOSIS — S93402A Sprain of unspecified ligament of left ankle, initial encounter: Secondary | ICD-10-CM | POA: Insufficient documentation

## 2016-04-22 DIAGNOSIS — Y999 Unspecified external cause status: Secondary | ICD-10-CM | POA: Insufficient documentation

## 2016-04-22 DIAGNOSIS — X58XXXA Exposure to other specified factors, initial encounter: Secondary | ICD-10-CM | POA: Insufficient documentation

## 2016-04-22 MED ORDER — IBUPROFEN 400 MG PO TABS
800.0000 mg | ORAL_TABLET | Freq: Once | ORAL | Status: AC
Start: 2016-04-22 — End: 2016-04-22
  Administered 2016-04-22: 800 mg via ORAL
  Filled 2016-04-22: qty 2

## 2016-04-22 MED ORDER — TRAMADOL HCL 50 MG PO TABS: 50.0000 mg | ORAL_TABLET | Freq: Four times a day (QID) | ORAL | 0 refills | Status: DC | PRN

## 2016-04-22 MED ORDER — TRAMADOL HCL 50 MG PO TABS
50.0000 mg | ORAL_TABLET | Freq: Once | ORAL | Status: AC
  Administered 2016-04-22: 50 mg via ORAL
  Filled 2016-04-22: qty 1

## 2016-04-22 MED ORDER — IBUPROFEN 800 MG PO TABS: 800.0000 mg | ORAL_TABLET | Freq: Three times a day (TID) | ORAL | 0 refills | Status: DC

## 2016-04-22 NOTE — ED Triage Notes (Signed)
Patient complains of 4 days of left foot pain with ambulation. Denies injury

## 2016-04-22 NOTE — ED Provider Notes (Signed)
[facility]-EMERGENCY DEPT Provider Note   CSN: [id number] Arrival date & time: 04/22/16  1139  By signing my name below, I, Christy Sartorius, attest that this documentation has been prepared under the direction and in the presence of  Danelle Berry, PA-C. Electronically Signed: Christy Sartorius, ED Scribe. 04/22/16. 3:39 PM.  History   Chief Complaint Chief Complaint  Patient presents with  . Foot Pain   The history is provided by the patient. No language interpreter was used.     HPI Comments:  Rachael Jackson is a 30 y.o. female who presents to the Emergency Department complaining of pain in her left foot and ankle beginning yesterday.  She describes the pain as 6/10 at rest and 10/10 when bearing weight; she states that it feels like the pain is under her bones.  She notes associated swelling at the site.  She denies recent inury or over exertion.  She also denies h/o joint pain gout and arthritis.  She has been icing the ankle with minimal relief, but has taken nothing for her pain.  She denies erythema.   Past Medical History:  Diagnosis Date  . GERD (gastroesophageal reflux disease)   . Medical history non-contributory     Patient Active Problem List   Diagnosis Date Noted  . Pregnancy 12/10/2012    Past Surgical History:  Procedure Laterality Date  . NO PAST SURGERIES      OB History    Gravida Para Term Preterm AB Living   3 3 3  0 0 3   SAB TAB Ectopic Multiple Live Births   0 0 0 0 1       Home Medications    Prior to Admission medications   Medication Sig Start Date End Date Taking? Authorizing Provider  azithromycin (ZITHROMAX) 250 MG tablet Take 1 tablet (250 mg total) by mouth daily. Take first 2 tablets together, then 1 every day until finished. 07/04/15   Melton Krebs, PA-C  cephALEXin (KEFLEX) 500 MG capsule Take 1 capsule (500 mg total) by mouth 4 (four) times daily. Patient not taking: Reported on 03/13/2015 03/08/14   Renne Crigler, PA-C    diphenhydrAMINE (BENADRYL) 25 MG tablet Take 1 tablet (25 mg total) by mouth every 6 (six) hours. Patient not taking: Reported on 03/13/2015 03/08/14   Renne Crigler, PA-C  ibuprofen (ADVIL,MOTRIN) 800 MG tablet Take 1 tablet (800 mg total) by mouth 3 (three) times daily. 04/22/16   Danelle Berry, PA-C  meclizine (ANTIVERT) 25 MG tablet Take 1 tablet (25 mg total) by mouth 4 (four) times daily. 03/13/15   Marlon Pel, PA-C  Multiple Vitamin (MULTIVITAMIN WITH MINERALS) TABS tablet Take 1 tablet by mouth as needed (only takes sometimes).    Historical Provider, MD  ondansetron (ZOFRAN) 4 MG tablet Take 1 tablet (4 mg total) by mouth every 6 (six) hours. 03/13/15   Tiffany Neva Seat, PA-C  traMADol (ULTRAM) 50 MG tablet Take 1 tablet (50 mg total) by mouth every 6 (six) hours as needed. 04/22/16   Danelle Berry, PA-C    Family History Family History  Problem Relation Age of Onset  . Alcohol abuse Neg Hx     Social History Social History  Substance Use Topics  . Smoking status: Never Smoker  . Smokeless tobacco: Never Used  . Alcohol use No     Allergies   Review of patient's allergies indicates no known allergies.   Review of Systems Review of Systems  All other systems reviewed and are negative.  Physical Exam Updated Vital Signs BP 129/90 (BP Location: Left Arm)   Pulse 71   Temp 98.3 F (36.8 C) (Oral)   Resp 18   LMP 04/01/2016   SpO2 99%   Physical Exam  Constitutional: She is oriented to person, place, and time. She appears well-developed and well-nourished. No distress.  HENT:  Head: Normocephalic and atraumatic.  Right Ear: External ear normal.  Left Ear: External ear normal.  Nose: Nose normal.  Mouth/Throat: Oropharynx is clear and moist. No oropharyngeal exudate.  Eyes: Conjunctivae and EOM are normal. Pupils are equal, round, and reactive to light. Right eye exhibits no discharge. Left eye exhibits no discharge. No scleral icterus.  Neck: Normal range of  motion. Neck supple. No JVD present. No tracheal deviation present.  Cardiovascular: Normal rate and regular rhythm.   Pulses:      Dorsalis pedis pulses are 2+ on the left side.       Posterior tibial pulses are 2+ on the left side.  Pulmonary/Chest: Effort normal and breath sounds normal. No stridor. No respiratory distress.  Abdominal: She exhibits no distension.  Musculoskeletal: Normal range of motion. She exhibits edema and tenderness. She exhibits no deformity.       Right ankle: She exhibits swelling. She exhibits no ecchymosis, no deformity and normal pulse. Tenderness. Lateral malleolus, medial malleolus, AITFL, CF ligament, posterior TFL and head of 5th metatarsal tenderness found. No proximal fibula tenderness found. Achilles tendon normal. Achilles tendon exhibits no pain, no defect and normal Thompson's test results.       Left foot: There is normal capillary refill.  Left ankle with medial and lateral malleous edema, ttp.   No erythema.   Normal sensation.  Normal capillary refill.      Feet:  Left Foot:  Protective Sensation: 5 sites tested. 5 sites sensed.  Skin Integrity: Negative for erythema.  Lymphadenopathy:    She has no cervical adenopathy.  Neurological: She is alert and oriented to person, place, and time. She exhibits normal muscle tone. Coordination normal.  Skin: Skin is warm and dry. No rash noted. She is not diaphoretic. No erythema. No pallor.  Psychiatric: She has a normal mood and affect. Her behavior is normal. Judgment and thought content normal.  Nursing note and vitals reviewed.    ED Treatments / Results   DIAGNOSTIC STUDIES:  Oxygen Saturation is 98% on RA, nml by my interpretation.    COORDINATION OF CARE:  3:30 PM Discussed treatment plan with pt at bedside and pt agreed to plan.  Labs (all labs ordered are listed, but only abnormal results are displayed) Labs Reviewed - No data to display  EKG  EKG Interpretation None        Radiology Dg Foot Complete Left  Result Date: 04/22/2016 CLINICAL DATA:  Ankle pain and swelling for 3 weeks, no known injury EXAM: LEFT FOOT - COMPLETE 3+ VIEW COMPARISON:  None. FINDINGS: Three views of the left foot submitted. No acute fracture or subluxation. No radiopaque foreign body. IMPRESSION: Negative. Electronically Signed   By: Natasha MeadLiviu  Pop M.D.   On: 04/22/2016 12:59    Procedures Procedures (including critical care time)  Medications Ordered in ED Medications  traMADol (ULTRAM) tablet 50 mg (50 mg Oral Given 04/22/16 1509)  ibuprofen (ADVIL,MOTRIN) tablet 800 mg (800 mg Oral Given 04/22/16 1509)     Initial Impression / Assessment and Plan / ED Course  I have reviewed the triage vital signs and the nursing notes.  Pertinent  labs & imaging results that were available during my care of the patient were reviewed by me and considered in my medical decision making (see chart for details).  Clinical Course   Left ankle pain and swelling, xray negative, neurovascularly intact, place in ankle brace, given crutches, advised RICE tx, PCP or ortho follow up, return precautions reviewed, pt discharged in good condition.  Final Clinical Impressions(s) / ED Diagnoses   Final diagnoses:  Ankle sprain, left, initial encounter    New Prescriptions Discharge Medication List as of 04/22/2016  2:54 PM    START taking these medications   Details  ibuprofen (ADVIL,MOTRIN) 800 MG tablet Take 1 tablet (800 mg total) by mouth 3 (three) times daily., Starting Sun 04/22/2016, Print       I personally performed the services described in this documentation, which was scribed in my presence. The recorded information has been reviewed and is accurate.      Danelle BerryLeisa Taraji Mungo, PA-C 04/25/16 1231    Alvira MondayErin Schlossman, MD 04/26/16 1354

## 2016-06-20 ENCOUNTER — Ambulatory Visit: Payer: Self-pay | Admitting: Family Medicine

## 2016-06-21 ENCOUNTER — Ambulatory Visit: Payer: Self-pay | Admitting: Family Medicine

## 2016-07-02 ENCOUNTER — Encounter: Payer: Self-pay | Admitting: Family Medicine

## 2016-07-02 ENCOUNTER — Ambulatory Visit: Payer: Self-pay | Attending: Family Medicine | Admitting: Family Medicine

## 2016-07-02 VITALS — BP 100/65 | HR 70 | Temp 98.0°F | Ht 64.0 in | Wt 140.4 lb

## 2016-07-02 DIAGNOSIS — M79631 Pain in right forearm: Secondary | ICD-10-CM | POA: Insufficient documentation

## 2016-07-02 DIAGNOSIS — M25472 Effusion, left ankle: Secondary | ICD-10-CM

## 2016-07-02 DIAGNOSIS — M25572 Pain in left ankle and joints of left foot: Secondary | ICD-10-CM

## 2016-07-02 DIAGNOSIS — M25531 Pain in right wrist: Secondary | ICD-10-CM

## 2016-07-02 DIAGNOSIS — K219 Gastro-esophageal reflux disease without esophagitis: Secondary | ICD-10-CM | POA: Insufficient documentation

## 2016-07-02 LAB — TSH: TSH: 1.98 mIU/L

## 2016-07-02 LAB — URIC ACID: URIC ACID, SERUM: 3.4 mg/dL (ref 2.5–7.0)

## 2016-07-02 NOTE — Progress Notes (Signed)
Pt is here today for left foot pain and right forearm pain.(x2 months)  Pt is getting flu shot today.

## 2016-07-02 NOTE — Assessment & Plan Note (Signed)
Joint swelling and pain that is mostly resolved but is unusual for such a young person with no inciting incident  inflammatory arthritis w/ with labs per orders

## 2016-07-02 NOTE — Progress Notes (Signed)
LOGO@  Subjective:  Patient ID: Rachael RamaSilvia Binstock, female    DOB: 05/25/1986  Age: 30 y.o. MRN: [id number]  CC: Establish Care   HPI Rachael Jackson is a R handed female, she has 3 children, she does not work outside of the home she presents for    1. L foot and ankle pain: started two months ago. Progressed rapidly over 3 days. Swelling and pain around L ankle. It was difficult to walk. No injury. The area was red in L medial ankle. She went to ED and ortho. X-ray revealed joint inflammation. She was treated with antiinflammatory and a boot. The antiinflammatory and boot did not help. Pain has improved over the past 3 weeks. She had masseuse and acupuncture. Cold makes L foot and ankle worse. Heat feels better. Her current pain level is 2/10.   2. Right forearm pain: started 1 month after L foot and anle swelling. Pain on dorsum of R hand and radiating ot R ulna up to elbow. Pain is achy. There is no swelling or redness in R hand or arm. She is R handed. Her current pain level is 3-4/10.   Past Medical History:  Diagnosis Date  . GERD (gastroesophageal reflux disease)   . Medical history non-contributory     Past Surgical History:  Procedure Laterality Date  . NO PAST SURGERIES      Family History  Problem Relation Age of Onset  . Alcohol abuse Neg Hx     Social History  Substance Use Topics  . Smoking status: Never Smoker  . Smokeless tobacco: Never Used  . Alcohol use No    ROS Review of Systems  Constitutional: Negative for chills and fever.  Eyes: Negative for visual disturbance.  Respiratory: Negative for shortness of breath.   Cardiovascular: Negative for chest pain.  Gastrointestinal: Negative for abdominal pain and blood in stool.  Musculoskeletal: Positive for arthralgias and joint swelling. Negative for back pain.  Skin: Negative for rash.  Allergic/Immunologic: Negative for immunocompromised state.  Hematological: Negative for adenopathy. Does not  bruise/bleed easily.  Psychiatric/Behavioral: Negative for dysphoric mood and suicidal ideas.    Objective:   Today's Vitals: BP 100/65 (BP Location: Left Arm, Patient Position: Sitting, Cuff Size: Small)   Pulse 70   Temp 98 F (36.7 C) (Oral)   Ht 5\' 4"  (1.626 m)   Wt 140 lb 6.4 oz (63.7 kg)   LMP 06/18/2016   SpO2 100%   BMI 24.10 kg/m   Physical Exam  Constitutional: She is oriented to person, place, and time. She appears well-developed and well-nourished. No distress.  HENT:  Head: Normocephalic and atraumatic.  Mouth/Throat: Oropharynx is clear and moist.  Eyes: Conjunctivae are normal. Pupils are equal, round, and reactive to light.  Cardiovascular: Normal rate, regular rhythm, normal heart sounds and intact distal pulses.   Pulmonary/Chest: Effort normal and breath sounds normal.  Musculoskeletal: She exhibits no edema or deformity.       Left ankle: Tenderness.       Feet:  Neurological: She is alert and oriented to person, place, and time.  Skin: Skin is warm and dry. No rash noted.  Psychiatric: She has a normal mood and affect.   Depression screen PHQ 2/9 07/02/2016  Decreased Interest 1  Down, Depressed, Hopeless 0  PHQ - 2 Score 1  Altered sleeping 1  Tired, decreased energy 1  Change in appetite 0  Feeling bad or failure about yourself  0  Trouble concentrating 0  Moving  slowly or fidgety/restless 0  Suicidal thoughts 0  PHQ-9 Score 3   GAD 7 : Generalized Anxiety Score 07/02/2016  Nervous, Anxious, on Edge 1  Control/stop worrying 0  Worry too much - different things 1  Trouble relaxing 1  Restless 0  Easily annoyed or irritable 1  Afraid - awful might happen 0  Total GAD 7 Score 4    Assessment & Plan:   Problem List Items Addressed This Visit    None    Visit Diagnoses    Right wrist pain    -  Primary   Relevant Orders   TSH   Vitamin D, 25-hydroxy   Sedimentation Rate   C-reactive protein   ANA,IFA RA Diag Pnl w/rflx Tit/Patn    Uric Acid   Pain and swelling of left ankle       Relevant Orders   TSH   Vitamin D, 25-hydroxy   Sedimentation Rate   C-reactive protein   ANA,IFA RA Diag Pnl w/rflx Tit/Patn   Uric Acid      Outpatient Encounter Prescriptions as of 07/02/2016  Medication Sig  . [DISCONTINUED] ibuprofen (ADVIL,MOTRIN) 800 MG tablet Take 1 tablet (800 mg total) by mouth 3 (three) times daily.  . [DISCONTINUED] Multiple Vitamin (MULTIVITAMIN WITH MINERALS) TABS tablet Take 1 tablet by mouth as needed (only takes sometimes).  . [DISCONTINUED] traMADol (ULTRAM) 50 MG tablet Take 1 tablet (50 mg total) by mouth every 6 (six) hours as needed.  . [DISCONTINUED] azithromycin (ZITHROMAX) 250 MG tablet Take 1 tablet (250 mg total) by mouth daily. Take first 2 tablets together, then 1 every day until finished. (Patient not taking: Reported on 07/02/2016)  . [DISCONTINUED] cephALEXin (KEFLEX) 500 MG capsule Take 1 capsule (500 mg total) by mouth 4 (four) times daily. (Patient not taking: Reported on 07/02/2016)  . [DISCONTINUED] diphenhydrAMINE (BENADRYL) 25 MG tablet Take 1 tablet (25 mg total) by mouth every 6 (six) hours. (Patient not taking: Reported on 07/02/2016)  . [DISCONTINUED] meclizine (ANTIVERT) 25 MG tablet Take 1 tablet (25 mg total) by mouth 4 (four) times daily. (Patient not taking: Reported on 07/02/2016)  . [DISCONTINUED] ondansetron (ZOFRAN) 4 MG tablet Take 1 tablet (4 mg total) by mouth every 6 (six) hours. (Patient not taking: Reported on 07/02/2016)   No facility-administered encounter medications on file as of 07/02/2016.     Follow-up: Return in about 6 weeks (around 08/13/2016) for L ankle and R wrist pain .    Dessa PhiJosalyn Ciela Mahajan MD

## 2016-07-02 NOTE — Patient Instructions (Addendum)
Rachael Jackson was seen today for establish care.  Diagnoses and all orders for this visit:  Right wrist pain -     TSH -     Vitamin D, 25-hydroxy -     Sedimentation Rate -     C-reactive protein -     ANA,IFA RA Diag Pnl w/rflx Tit/Patn -     Uric Acid  Pain and swelling of left ankle -     TSH -     Vitamin D, 25-hydroxy -     Sedimentation Rate -     C-reactive protein -     ANA,IFA RA Diag Pnl w/rflx Tit/Patn -     Uric Acid   You will be called with lab results Continue to take ibuprofen as needed with food for pain  Continue heat since it is soothing   F/u in 6 weeks for ankle and wrist pain   Dr. Armen PickupFunches

## 2016-07-03 ENCOUNTER — Telehealth: Payer: Self-pay

## 2016-07-03 LAB — ANA,IFA RA DIAG PNL W/RFLX TIT/PATN
Anti Nuclear Antibody(ANA): NEGATIVE
Rhuematoid fact SerPl-aCnc: <14 IU/mL (ref <14)

## 2016-07-03 LAB — SEDIMENTATION RATE: Sed Rate: 8 mm/hr (ref 0–20)

## 2016-07-03 LAB — VITAMIN D 25 HYDROXY (VIT D DEFICIENCY, FRACTURES): Vit D, 25-Hydroxy: 49 ng/mL (ref 30–100)

## 2016-07-03 LAB — C-REACTIVE PROTEIN: CRP: 0.7 mg/L (ref <8.0)

## 2016-07-03 NOTE — Telephone Encounter (Addendum)
Patient fully hipaa verified per guidelines.  RN advised patient per Dr. Armen PickupFunches: Negative auto immune antibodies No evidence of lupus or rheumatoid arthritis  Patient verbalized understanding.

## 2017-01-01 ENCOUNTER — Encounter: Payer: Self-pay | Admitting: Family Medicine

## 2017-01-01 ENCOUNTER — Ambulatory Visit: Payer: Self-pay | Attending: Family Medicine | Admitting: Family Medicine

## 2017-01-01 VITALS — BP 102/68 | HR 74 | Temp 97.8°F | Ht 64.0 in | Wt 146.0 lb

## 2017-01-01 DIAGNOSIS — J302 Other seasonal allergic rhinitis: Secondary | ICD-10-CM | POA: Insufficient documentation

## 2017-01-01 DIAGNOSIS — J301 Allergic rhinitis due to pollen: Secondary | ICD-10-CM

## 2017-01-01 DIAGNOSIS — R51 Headache: Secondary | ICD-10-CM | POA: Insufficient documentation

## 2017-01-01 DIAGNOSIS — J029 Acute pharyngitis, unspecified: Secondary | ICD-10-CM | POA: Insufficient documentation

## 2017-01-01 MED ORDER — CETIRIZINE HCL 10 MG PO TABS
10.0000 mg | ORAL_TABLET | Freq: Every day | ORAL | 5 refills | Status: DC
Start: 2017-01-01 — End: 2018-09-24

## 2017-01-01 MED ORDER — FLUTICASONE PROPIONATE 50 MCG/ACT NA SUSP: 2.0000 | Freq: Every day | NASAL | 6 refills | Status: DC

## 2017-01-01 MED ORDER — PREDNISONE 20 MG PO TABS: ORAL_TABLET | ORAL | 0 refills | Status: DC

## 2017-01-01 MED FILL — ?PREDNISONE 20 MG TABLET: 20 | 6 days supply | Qty: 12 | Fill #0

## 2017-01-01 MED FILL — FLUTICASONE PROP 50 MCG SPR: 50 | 25 days supply | Qty: 16 | Fill #0

## 2017-01-01 MED FILL — ?CETIRIZINE HCL 10 MG TABLE: 10 | 30 days supply | Qty: 30 | Fill #0

## 2017-01-01 NOTE — Progress Notes (Signed)
   Subjective:  Patient ID: Jeananne Rama, female    DOB: March 21, 1986  Age: 31 y.o. MRN: [id number]  CC: Sore Throat and Allergies   HPI Sobia Karger presents for   1. Sore throat: started 4 days ago.  Pain with swallowing.Associated with headache. Taking Claritin and ibuprofen for pain which help a little bit. She feels her throat is closing and she cannot swallow well. She has similar symptoms last year. For one month prior she has itchy and water eyes, nasal congestion and sneezing.    No outpatient prescriptions prior to visit.   No facility-administered medications prior to visit.     ROS Review of Systems  Constitutional: Negative for chills and fever.  HENT: Positive for sinus pressure, sneezing, sore throat, trouble swallowing and voice change.   Eyes: Negative for visual disturbance.  Respiratory: Negative for cough and shortness of breath.   Cardiovascular: Negative for chest pain.  Gastrointestinal: Negative for abdominal pain and blood in stool.  Musculoskeletal: Negative for arthralgias and back pain.  Skin: Negative for rash.  Allergic/Immunologic: Negative for immunocompromised state.  Hematological: Negative for adenopathy. Does not bruise/bleed easily.  Psychiatric/Behavioral: Negative for dysphoric mood and suicidal ideas.    Objective:  BP 102/68   Pulse 74   Temp 97.8 F (36.6 C) (Oral)   Ht  (1.626 m)   Wt 146 lb (66.2 kg)   SpO2 98%   BMI 25.06 kg/m   BP/Weight 01/01/2017 07/02/2016 04/22/2016  Systolic BP 102 100 129  Diastolic BP 68 65 90  Wt. (Lbs) 146 140.4 -  BMI 25.06 24.1 -    Physical Exam  Constitutional: She is oriented to person, place, and time. She appears well-developed and well-nourished. No distress.  HENT:  Head: Normocephalic and atraumatic.  Right Ear: Tympanic membrane, external ear and ear canal normal.  Left Ear: Tympanic membrane, external ear and ear canal normal.  Nose: Mucosal edema present.    Mouth/Throat: Oropharynx is clear and moist.  Cardiovascular: Normal rate, regular rhythm, normal heart sounds and intact distal pulses.   Pulmonary/Chest: Effort normal and breath sounds normal.  Musculoskeletal: She exhibits no edema.  Lymphadenopathy:    She has no cervical adenopathy.  Neurological: She is alert and oriented to person, place, and time.  Skin: Skin is warm and dry. No rash noted.  Psychiatric: She has a normal mood and affect.   Rapid strep: negative   Assessment & Plan:  Shyra was seen today for sore throat and allergies.  Diagnoses and all orders for this visit:  Seasonal allergic rhinitis due to pollen -     cetirizine (ZYRTEC) 10 MG tablet; Take 1 tablet (10 mg total) by mouth daily. -     fluticasone (FLONASE) 50 MCG/ACT nasal spray; Place 2 sprays into both nostrils daily. -     predniSONE (DELTASONE) 20 MG tablet; Take every by mouth morning with food 60 mg daily for 2 days, 40 mg daily for 2 days,  20 mg daily for 2 days Then STOP   There are no diagnoses linked to this encounter.  No orders of the defined types were placed in this encounter.   Follow-up: Return in about 3 weeks (around 01/22/2017) for pap smear.   Dessa Phi MD

## 2017-01-01 NOTE — Assessment & Plan Note (Signed)
A: s/s of seasonal allergies P: Prednisone taper zyrtec flonase

## 2017-01-01 NOTE — Patient Instructions (Addendum)
Rachael Jackson was seen today for sore throat and allergies.  Diagnoses and all orders for this visit:  Seasonal allergic rhinitis due to pollen -     cetirizine (ZYRTEC) 10 MG tablet; Take 1 tablet (10 mg total) by mouth daily. -     fluticasone (FLONASE) 50 MCG/ACT nasal spray; Place 2 sprays into both nostrils daily. -     predniSONE (DELTASONE) 20 MG tablet; Take every by mouth morning with food 60 mg daily for 2 days, 40 mg daily for 2 days,  20 mg daily for 2 days Then STOP   Be sure to change the HVAC filter at home  Our  records show you are due for pap smear  f/u in 3 weeks for pap smear   Dr. Skipper Cliche en los adultos (Allergies, Adult) Rachael Jackson se produce cuando el sistema de defensa del cuerpo (sistema inmunitario) reacciona en exceso a una sustancia normalmente inofensiva (alrgeno), que se respira o se come, o entra en contacto con la piel. Al estar en contacto con algo a lo que se es alrgico, el sistema inmunitario produce ciertas protenas (anticuerpos). Estas protenas hacen que las clulas liberen sustancias qumicas (histaminas) que desencadenan los sntomas de una reaccin Rachael Jackson. Las Chief of Staff las fosas nasales (rinitis alrgica), los ojos (conjuntivitis alrgica), la piel (dermatitis atpica) y Investment banker, corporate. Las [employer] pueden ser leves o graves. No se pueden diseminar de Rachael Jackson persona a otra (no es contagiosa). Pueden aparecer a cualquier edad y se pueden superar con los [place]. CAUSAS La causa de las alergias puede ser cualquier sustancia que el sistema inmunitario identifica errneamente como daina. Estas pueden incluir lo siguiente:  Alrgenos externos, como el polen, el pasto, las [place], el escape de gases de automviles y esporas del moho.  Alrgenos internos, como el polvo, el humo, el moho y la caspa de las [place].  Alimentos, especficamente el man, la [place], los [place], el pescado, los mariscos, la soja, las nueces y [place].  Medicamentos, como la penicilina.  Irritantes de la piel, como los detergentes, las sustancias qumicas y el ltex.  Perfume.  Las picaduras o mordeduras de insectos. FACTORES DE RIESGO Puede tener un riesgo mayor de sufrir Rachael Jackson si otras personas de su familia tienen [place]. SNTOMAS Los sntomas dependen del tipo de [name] que tenga. Estos pueden incluir los siguientes:  Secrecin o congestin nasal.  Estornudos.  Picazn en la boca, los odos o la garganta.  Goteo posnasal.  Dolor de garganta.  Ojos rojos, lagrimosos, hinchados o con picazn.  Erupcin o ronchas en la piel.  Dolor de Teaching laboratory technician.  Vmitos.  Diarrea.  Meteorismo.  Tos o sibilancias. Las Dealer con una alergia grave a los alimentos, los medicamentos o la picadura de insectos pueden tener una reaccin alrgica potencialmente mortal (anafilaxia). Los sntomas de la anafilaxia incluyen:  Ronchas.  Picazn.  Cara enrojecida.  Labios, lengua o boca hinchados.  Hinchazn u opresin en la garganta.  Dolor u opresin en el pecho.  Dificultad para respirar o falta de aire.  Latidos cardacos rpidos.  Mareos o [employer].  Vmitos.  Diarrea.  Dolor en el abdomen. DIAGNSTICO Esta afeccin se diagnostica en funcin de lo siguiente:  Sus sntomas.  Sus antecedentes mdicos y familiares.  Un examen fsico. Es posible que necesite ver a un mdico especialista en el tratamiento de Rachael Jackson (Rachael Jackson). Tambin pueden hacerle exmenes que incluyen:  Pruebas de piel para detectar qu alrgenos causan los sntomas. por ejemplo:  Prueba de puncin. En esta prueba, se pincha la piel con una aguja diminuta para exponerla a pequeas cantidades de posibles alrgenos y ver si la piel reacciona.  Prueba cutnea intradrmica. En esta prueba, se inyecta una pequea cantidad de alrgeno debajo de la piel para ver si esta reacciona.  Prueba de parche. En esta prueba, se coloca una pequea  cantidad de alrgeno en la piel y luego se cubre con una venda. El mdico examinar la piel despus de un par de [address]  [phone] para ver si hay una erupcin cutnea.  Anlisis de Rachael Jackson.  Pruebas de provocacin. En esta prueba, debe inhalar una pequea cantidad de alrgeno por boca para ver si produce una reaccin alrgica. Tambin pueden pedirle que:  Lleve un registro de alimentos. Un registro de [employer] todos los alimentos y las bebidas que usted consume en un da, y cualquier sntoma que experimenta.  Practique una dieta de eliminacin. Una dieta de eliminacin implica eliminar alimentos especficos de la dieta y luego incorporarlos nuevamente, uno a la vez, para averiguar si hay uno en particular que le cause Runner, broadcasting/film/video. TRATAMIENTO El tratamiento para las alergias depende de los sntomas. El tratamiento puede incluir lo siguiente:  Compresas fras para Technical sales engineer picazn y la hinchazn.  Gotas oftlmicas.  Aerosoles nasales.  Usar un aerosol salino o lota nasal (neti pot) para lavar la nariz (irrigacin nasal). Estos mtodos pueden ayudar a Pharmacologist mucosidad y [employer] fosas nasales hmedas.  El uso de un humidificador.  Antihistamnicos orales u otros medicamentos para Scientist, forensic y la inflamacin.  Cremas para la piel para tratar las erupciones o la picazn.  Cambios en la dieta para eliminar los desencadenantes de la alergia a los alimentos.  Exposicin repetida a cantidades diminutas de alrgenos para generar tolerancia y prevenir reacciones alrgicas futuras (inmunoterapia). Estos incluyen los siguientes:  Vacunas contra la Programmer, multimedia.  Tratamiento por va oral. Esto incluye pequeas dosis de un alrgeno debajo de la lengua (inmunoterapia sublingual).  Inyeccin de epinefrina de Insurance claims handler) en caso de Chief Strategy Officer. Se trata de un medicamento autoinyectable y medido previamente que se debe Building services engineer en los primeros  minutos de una reaccin alrgica grave. INSTRUCCIONES PARA EL CUIDADO EN EL HOGAR  Evite los alrgenos conocidos, siempre que sea posible.  Si sufre de alergia por alrgenos que estn en el aire, lvese la nariz a diario. Puede usar un aerosol o una lota nasal (neti pot) para lavar la nariz (irrigacin nasal).  Tome los medicamentos de venta libre y los recetados solamente como se lo haya indicado el mdico.  Rachael Jackson a todas las visitas de control como se lo haya indicado el mdico. Esto es importante.  Si tiene riesgo de sufrir Runner, broadcasting/film/video grave (anafilaxia), lleve su autoinyector con usted en todo momento.  Si alguna vez ha tenido [place], use una pulsera o collar de alerta mdica que diga que usted tiene [place] grave. SOLICITE ATENCIN MDICA SI:  Los sntomas no mejoran con Scientist, research (medical). SOLICITE ATENCIN MDICA DE INMEDIATO SI:  Tiene sntomas de anafilaxia, como los siguientes:  Boca, [place] o garganta hinchadas.  Dolor u opresin en el pecho.  Dificultad para respirar o falta de aire.  Mareos o [employer].  Dolor abdominal intenso, vmitos o diarrea. Esta informacin no tiene Theme park manager el consejo del mdico. Asegrese de hacerle al mdico cualquier pregunta que tenga. Document Released: 08/20/2005 Document Revised: 09/10/2014 Document Reviewed: 03/07/2016 Elsevier Interactive Patient Education  2017 Elsevier  Inc.  

## 2017-01-16 ENCOUNTER — Encounter: Payer: Self-pay | Admitting: Family Medicine

## 2018-07-18 IMAGING — CR DG FOOT COMPLETE 3+V*L*
3 series · 3 of 3 positions shown · non-contrast
Comparison: None.

CLINICAL DATA: Ankle pain and swelling for 3 weeks, no known injury

EXAM:
LEFT FOOT - COMPLETE 3+ VIEW

[foot ap]
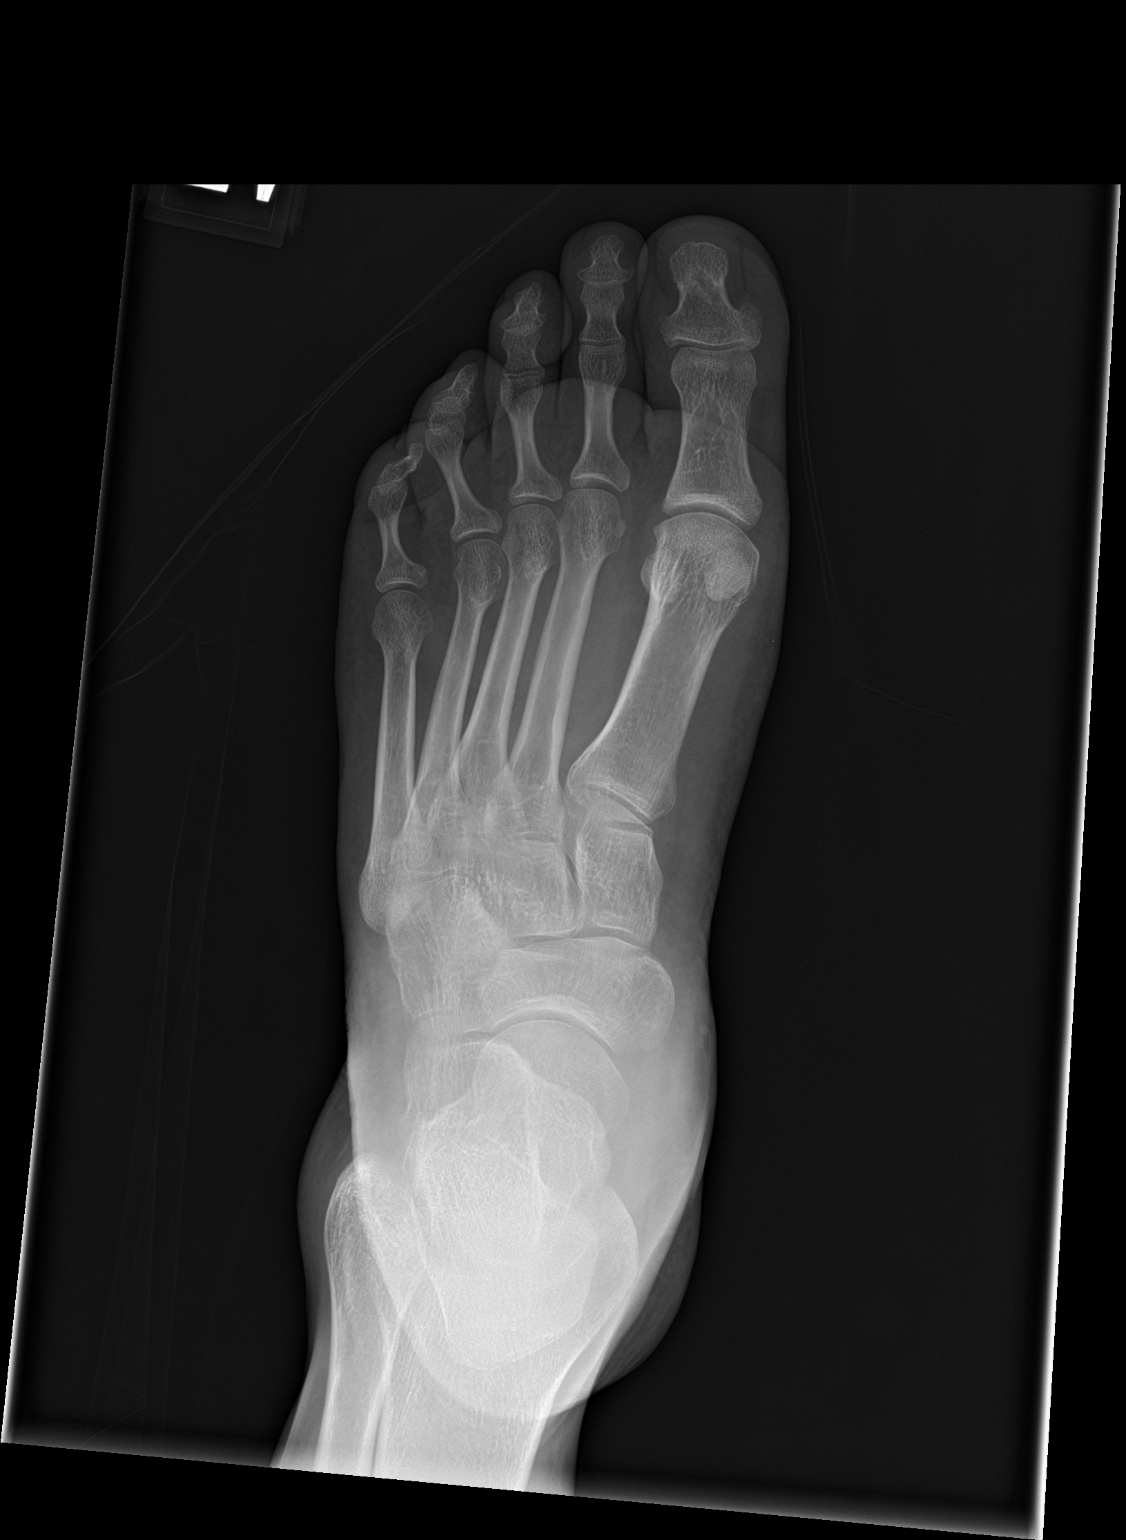

[foot obl]
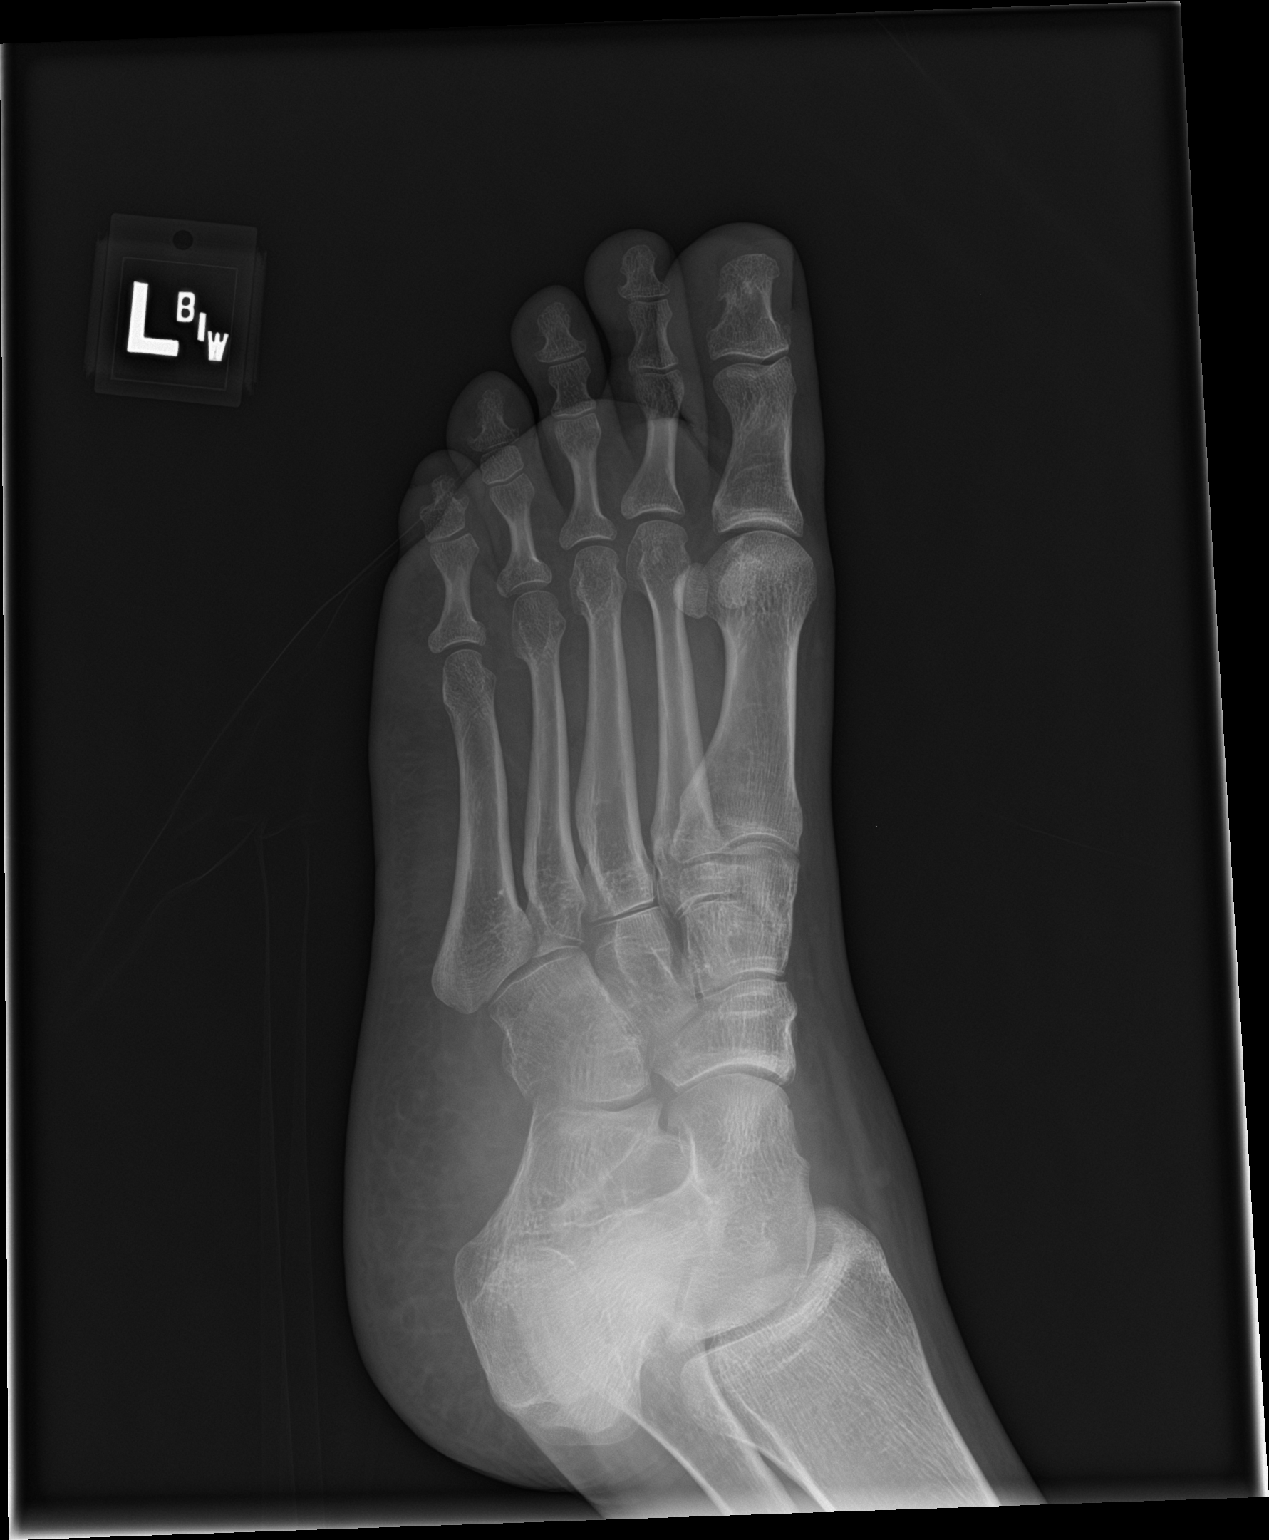

[foot lat]
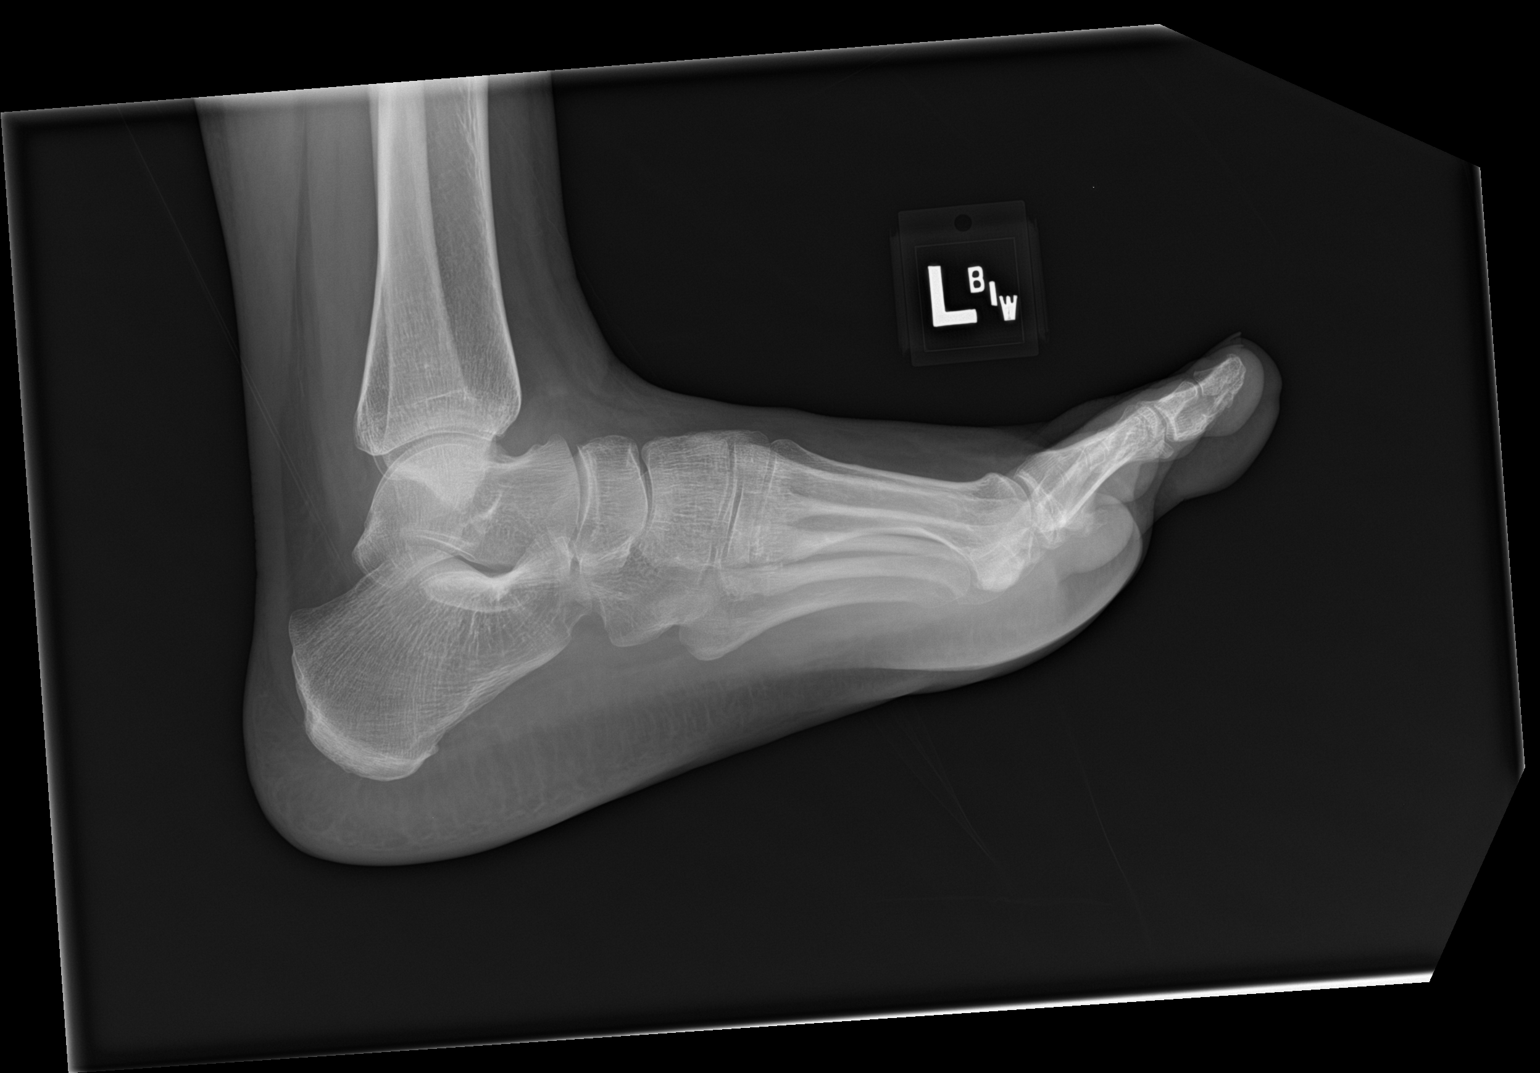

[3 of 3 positions shown; findings below may reference images not displayed]

FINDINGS: Three views of the left foot submitted. No acute fracture or
subluxation. No radiopaque foreign body.
IMPRESSION: Negative.

## 2018-09-24 ENCOUNTER — Encounter: Payer: Self-pay | Admitting: Family Medicine

## 2018-09-24 ENCOUNTER — Ambulatory Visit: Payer: Self-pay | Attending: Family Medicine | Admitting: Family Medicine

## 2018-09-24 VITALS — BP 113/69 | HR 65 | Temp 97.6°F | Wt 148.0 lb

## 2018-09-24 DIAGNOSIS — K219 Gastro-esophageal reflux disease without esophagitis: Secondary | ICD-10-CM | POA: Insufficient documentation

## 2018-09-24 DIAGNOSIS — R519 Headache, unspecified: Secondary | ICD-10-CM

## 2018-09-24 DIAGNOSIS — J3489 Other specified disorders of nose and nasal sinuses: Secondary | ICD-10-CM

## 2018-09-24 DIAGNOSIS — R51 Headache: Secondary | ICD-10-CM | POA: Insufficient documentation

## 2018-09-24 MED ORDER — CETIRIZINE HCL 10 MG PO TABS: 10.0000 mg | ORAL_TABLET | Freq: Every day | ORAL | 1 refills | Status: DC

## 2018-09-24 MED ORDER — FLUTICASONE PROPIONATE 50 MCG/ACT NA SUSP: 2.0000 | Freq: Every day | NASAL | 1 refills | Status: DC

## 2018-09-24 MED ORDER — OMEPRAZOLE 20 MG PO CPDR: 20.0000 mg | DELAYED_RELEASE_CAPSULE | Freq: Every day | ORAL | 3 refills | Status: DC

## 2018-09-24 MED FILL — OMEPRAZOLE 20 MG CAP: 20 | 30 days supply | Qty: 30 | Fill #0

## 2018-09-24 NOTE — Progress Notes (Signed)
Subjective:  Patient ID: Rachael RamaSilvia Jeppsen, female    DOB: 01/22/1986  Age: 33 y.o. MRN: [id number]  CC: Headache and Abdominal Pain   HPI Rachael Jackson is a 33 year old female who presents to the clinic with complains of a one week h/o malaise, chills, left sided headache, myalgia, nausea, dizziness. She did have some ear fullness which has resolved. Denies fever, rhinorrhea, cough, dyspnea Also endorses epigastric pain and reflux. States one month ago her son had the flu.  Past Medical History:  Diagnosis Date  . GERD (gastroesophageal reflux disease)   . Medical history non-contributory     Past Surgical History:  Procedure Laterality Date  . NO PAST SURGERIES      No Known Allergies   Outpatient Medications Prior to Visit  Medication Sig Dispense Refill  . predniSONE (DELTASONE) 20 MG tablet Take every by mouth morning with food 60 mg daily for 2 days, 40 mg daily for 2 days,  20 mg daily for 2 days Then STOP (Patient not taking: Reported on 09/24/2018) 12 tablet 0  . cetirizine (ZYRTEC) 10 MG tablet Take 1 tablet (10 mg total) by mouth daily. (Patient not taking: Reported on 09/24/2018) 30 tablet 5  . fluticasone (FLONASE) 50 MCG/ACT nasal spray Place 2 sprays into both nostrils daily. (Patient not taking: Reported on 09/24/2018) 16 g 6   No facility-administered medications prior to visit.     ROS Review of Systems  Constitutional: Negative for activity change, appetite change and fatigue.  HENT: Negative for congestion, sinus pressure and sore throat.   Eyes: Negative for visual disturbance.  Respiratory: Negative for cough, chest tightness, shortness of breath and wheezing.   Cardiovascular: Negative for chest pain and palpitations.  Gastrointestinal: Positive for abdominal pain and nausea. Negative for abdominal distention and constipation.  Endocrine: Negative for polydipsia.  Genitourinary: Negative for dysuria and frequency.  Musculoskeletal: Negative  for arthralgias and back pain.  Skin: Negative for rash.  Neurological: Positive for headaches. Negative for tremors, light-headedness and numbness.  Hematological: Does not bruise/bleed easily.  Psychiatric/Behavioral: Negative for agitation and behavioral problems.    Objective:  BP 113/69   Pulse 65   Temp 97.6 F (36.4 C) (Oral)   Wt 148 lb (67.1 kg)   SpO2 100%   BMI 25.40 kg/m   BP/Weight 09/24/2018 01/01/2017 07/02/2016  Systolic BP 113 102 100  Diastolic BP 69 68 65  Wt. (Lbs) 148 146 140.4  BMI 25.4 25.06 24.1      Physical Exam Constitutional:      Appearance: She is well-developed.  Cardiovascular:     Rate and Rhythm: Normal rate.     Heart sounds: Normal heart sounds. No murmur.  Pulmonary:     Effort: Pulmonary effort is normal.     Breath sounds: Normal breath sounds. No wheezing or rales.  Chest:     Chest wall: No tenderness.  Abdominal:     General: Bowel sounds are normal. There is no distension.     Palpations: Abdomen is soft. There is no mass.     Tenderness: There is no abdominal tenderness.  Musculoskeletal: Normal range of motion.  Neurological:     Mental Status: She is alert and oriented to person, place, and time.  Psychiatric:        Mood and Affect: Mood normal.        Behavior: Behavior normal.      Assessment & Plan:   1. Sinus headache Symptomatic management, increase  fluid intake, Tylenol prn. Low suspicion for the flu - cetirizine (ZYRTEC) 10 MG tablet; Take 1 tablet (10 mg total) by mouth daily.  Dispense: 30 tablet; Refill: 1 - fluticasone (FLONASE) 50 MCG/ACT nasal spray; Place 2 sprays into both nostrils daily.  Dispense: 16 g; Refill: 1 - CBC with Differential/Platelet - Basic Metabolic Panel  2. Gastroesophageal reflux disease without esophagitis Placed on Omeprazole Avoid recumbency up to 2 hours post meal   Meds ordered this encounter  Medications  . cetirizine (ZYRTEC) 10 MG tablet    Sig: Take 1 tablet (10  mg total) by mouth daily.    Dispense:  30 tablet    Refill:  1  . fluticasone (FLONASE) 50 MCG/ACT nasal spray    Sig: Place 2 sprays into both nostrils daily.    Dispense:  16 g    Refill:  1  . omeprazole (PRILOSEC) 20 MG capsule    Sig: Take 1 capsule (20 mg total) by mouth daily.    Dispense:  30 capsule    Refill:  3    Follow-up: Return if symptoms worsen or fail to improve.   Hoy Register MD

## 2018-09-24 NOTE — Progress Notes (Signed)
Patient has been having nausea,headaches, abdominal pain for 1 week.

## 2018-09-24 NOTE — Patient Instructions (Signed)
Sinus Headache    A sinus headache happens when your sinuses get swollen or blocked (clogged). Sinuses are spaces behind the bones of your face and forehead. You may feel pain or pressure in your face, forehead, ears, or upper teeth. Sinus headaches can be mild or very bad.  Follow these instructions at home:  General instructions  · If told:  ? Apply a warm, moist washcloth to your face. This can help to lessen pain.  ? Use a nasal saline wash. Follow the directions on the bottle or box.  Medicines    · Take over-the-counter and prescription medicines only as told by your doctor.  · If you were prescribed an antibiotic medicine, take it as told by your doctor. Do not stop taking it even if you start to feel better.  · Use a nose spray if your nose feels full of mucus (congested).  Hydrate and humidify  · Drink enough water to keep your pee (urine) pale yellow.  · Use a cool mist humidifier to keep the humidity level in your home above 50%.  · Breathe in steam for 10-15 minutes, 3-4 times a day or as told by your doctor. You can do this in the bathroom while a hot shower is running.  · Try not to spend time in cool or dry air.  Contact a doctor if:  · You get more than one headache a week.  · Light or sound bothers you.  · You have a fever.  · You feel sick to your stomach (nauseous) or you throw up (vomit).  · Your headaches do not get better with treatment.  Get help right away if:  · You have trouble seeing.  · You suddenly have very bad pain in your face or head.  · You start to have quick, sudden movements or shaking that you cannot control (seizure).  · You are confused.  · You have a stiff neck.  Summary  · A sinus headache happens when your sinuses get swollen or blocked (clogged). Sinuses are spaces behind the bones of your face and forehead.  · You may feel pain or pressure in your face, forehead, ears, or upper teeth.  · Take over-the-counter and prescription medicines only as told by your doctor.  · If  told, apply a warm, moist washcloth to your face. This can help to lessen pain.  This information is not intended to replace advice given to you by your health care provider. Make sure you discuss any questions you have with your health care provider.  Document Released: 12/20/2010 Document Revised: 05/31/2017 Document Reviewed: 05/31/2017  Elsevier Interactive Patient Education © 2019 Elsevier Inc.

## 2018-09-25 LAB — BASIC METABOLIC PANEL
BUN/Creatinine Ratio: 31 — ABNORMAL HIGH (ref 9–23)
BUN: 15 mg/dL (ref 6–20)
CALCIUM: 9.4 mg/dL (ref 8.7–10.2)
CO2: 22 mmol/L (ref 20–29)
Chloride: 103 mmol/L (ref 96–106)
Creatinine, Ser: 0.49 mg/dL — ABNORMAL LOW (ref 0.57–1.00)
GFR calc Af Amer: 147 mL/min/{1.73_m2} (ref >59)
GFR calc non Af Amer: 128 mL/min/{1.73_m2} (ref >59)
Glucose: 74 mg/dL (ref 65–99)
POTASSIUM: 4.4 mmol/L (ref 3.5–5.2)
Sodium: 142 mmol/L (ref 134–144)

## 2018-09-25 LAB — CBC WITH DIFFERENTIAL/PLATELET
Basophils Absolute: 0 10*3/uL (ref 0.0–0.2)
Basos: 0 %
EOS (ABSOLUTE): 0.1 10*3/uL (ref 0.0–0.4)
Eos: 2 %
Hematocrit: 38 % (ref 34.0–46.6)
Hemoglobin: 12.8 g/dL (ref 11.1–15.9)
Immature Grans (Abs): 0 10*3/uL (ref 0.0–0.1)
Immature Granulocytes: 0 %
LYMPHS: 38 %
Lymphocytes Absolute: 1.9 10*3/uL (ref 0.7–3.1)
MCH: 31.8 pg (ref 26.6–33.0)
MCHC: 33.7 g/dL (ref 31.5–35.7)
MCV: 95 fL (ref 79–97)
Monocytes Absolute: 0.4 10*3/uL (ref 0.1–0.9)
Monocytes: 7 %
NEUTROS PCT: 53 %
Neutrophils Absolute: 2.6 10*3/uL (ref 1.4–7.0)
Platelets: 196 10*3/uL (ref 150–450)
RBC: 4.02 x10E6/uL (ref 3.77–5.28)
RDW: 12 % (ref 11.7–15.4)
WBC: 4.9 10*3/uL (ref 3.4–10.8)

## 2018-09-26 ENCOUNTER — Telehealth: Payer: Self-pay | Admitting: Family Medicine

## 2018-09-26 NOTE — Telephone Encounter (Signed)
Patient called to check on the status of their results please follow up.

## 2018-10-06 ENCOUNTER — Telehealth: Payer: Self-pay

## 2018-10-06 NOTE — Telephone Encounter (Signed)
-----   Message from Hoy RegisterEnobong Newlin, MD sent at 09/25/2018  1:28 PM EST ----- Labs reveal evidence of dehydration.  Please advised to increase fluid intake.  No evidence of infection.

## 2018-10-06 NOTE — Telephone Encounter (Signed)
Patient was called and informed of lab results via interpreter(pedro [id number])

## 2019-06-24 ENCOUNTER — Ambulatory Visit: Payer: Self-pay | Admitting: Family Medicine

## 2019-06-29 ENCOUNTER — Ambulatory Visit: Payer: Self-pay | Attending: Family Medicine | Admitting: *Deleted

## 2019-06-29 ENCOUNTER — Other Ambulatory Visit: Payer: Self-pay

## 2019-06-29 DIAGNOSIS — R1030 Lower abdominal pain, unspecified: Secondary | ICD-10-CM

## 2019-06-29 LAB — POCT URINALYSIS DIP (CLINITEK)
Bilirubin, UA: NEGATIVE
Blood, UA: NEGATIVE
Glucose, UA: NEGATIVE mg/dL
Ketones, POC UA: NEGATIVE mg/dL
Leukocytes, UA: NEGATIVE
Nitrite, UA: NEGATIVE
POC PROTEIN,UA: NEGATIVE
Spec Grav, UA: 1.02
Urobilinogen, UA: 0.2 U/dL
pH, UA: 5.5

## 2019-06-29 NOTE — Progress Notes (Signed)
Patient complains of lower abdominal pain.

## 2019-06-29 NOTE — Patient Instructions (Signed)
Patient is aware of no UTI being noted on her UA. Patient advised to schedule an OV with her PCP for lower abdominal pain and chronic follow up.

## 2019-07-16 ENCOUNTER — Other Ambulatory Visit: Payer: Self-pay

## 2019-07-16 ENCOUNTER — Encounter (HOSPITAL_COMMUNITY): Payer: Self-pay | Admitting: Emergency Medicine

## 2019-07-16 ENCOUNTER — Ambulatory Visit (HOSPITAL_COMMUNITY)
Admission: EM | Admit: 2019-07-16 | Discharge: 2019-07-16 | Disposition: A | Discharge status: Home / Self Care | Payer: Self-pay | Attending: Internal Medicine | Admitting: Internal Medicine

## 2019-07-16 DIAGNOSIS — Z3202 Encounter for pregnancy test, result negative: Secondary | ICD-10-CM

## 2019-07-16 DIAGNOSIS — R350 Frequency of micturition: Secondary | ICD-10-CM

## 2019-07-16 DIAGNOSIS — R300 Dysuria: Secondary | ICD-10-CM

## 2019-07-16 DIAGNOSIS — N3000 Acute cystitis without hematuria: Secondary | ICD-10-CM

## 2019-07-16 LAB — POCT URINALYSIS DIP (DEVICE)
Bilirubin Urine: NEGATIVE
Glucose, UA: NEGATIVE mg/dL
Ketones, ur: NEGATIVE mg/dL
Nitrite: NEGATIVE
Protein, ur: NEGATIVE mg/dL
Specific Gravity, Urine: >=1.03 (ref 1.005–1.030)
Urobilinogen, UA: 0.2 mg/dL (ref 0.0–1.0)
pH: 5 (ref 5.0–8.0)

## 2019-07-16 LAB — POCT PREGNANCY, URINE: Preg Test, Ur: NEGATIVE

## 2019-07-16 MED ORDER — NITROFURANTOIN MONOHYD MACRO 100 MG PO CAPS: 100.0000 mg | ORAL_CAPSULE | Freq: Two times a day (BID) | ORAL | 0 refills | Status: DC

## 2019-07-16 NOTE — ED Triage Notes (Signed)
Frequent urination and burning for a few weeks, significantly worse yesterday. Denies abdominal pain or back pain

## 2019-07-16 NOTE — ED Provider Notes (Signed)
[facility]   MRN: [id number] DOB: [birth year]  Subjective:   Rachael Jackson is a 33 y.o. female presenting for 2-week history of persistent daily moderate to severe dysuria, urinary frequency and urinary urgency.  Patient states that she has a history of urinary irritation and infections.  She also admits that her urinalysis does not always show urinary tract infection.  Patient admits that she does not always hydrate as well as she should with water, drinks 2 to 3 cups of coffee regularly.  Denies fever, nausea, vomiting, belly pain, pelvic pain, genital rash, flank pain.  Patient is in a monogamous relationship to the best of her knowledge, uses condoms for protection consistently.  She is not interested in getting STI testing today.  Patient is not taking any medications regularly.  No Known Allergies  Past Medical History:  Diagnosis Date  . GERD (gastroesophageal reflux disease)   . Medical history non-contributory      Past Surgical History:  Procedure Laterality Date  . NO PAST SURGERIES      Family History  Problem Relation Age of Onset  . Alcohol abuse Neg Hx     Social History   Tobacco Use  . Smoking status: Never Smoker  . Smokeless tobacco: Never Used  Substance Use Topics  . Alcohol use: No  . Drug use: No    ROS   Objective:   Vitals: BP (!) 104/54 (BP Location: Right Arm)   Pulse 66   Temp 98.4 F (36.9 C) (Oral)   Resp 18   LMP 06/15/2019   SpO2 100%   Physical Exam Constitutional:      General: She is not in acute distress.    Appearance: Normal appearance. She is well-developed and normal weight. She is not ill-appearing, toxic-appearing or diaphoretic.  HENT:     Head: Normocephalic and atraumatic.     Right Ear: External ear normal.     Left Ear: External ear normal.     Nose: Nose normal.     Mouth/Throat:     Mouth: Mucous membranes are moist.     Pharynx: Oropharynx is clear.  Eyes:     General: No scleral  icterus.    Extraocular Movements: Extraocular movements intact.     Pupils: Pupils are equal, round, and reactive to light.  Cardiovascular:     Rate and Rhythm: Normal rate and regular rhythm.     Heart sounds: Normal heart sounds. No murmur. No friction rub. No gallop.   Pulmonary:     Effort: Pulmonary effort is normal. No respiratory distress.     Breath sounds: Normal breath sounds. No stridor. No wheezing, rhonchi or rales.  Abdominal:     General: Bowel sounds are normal. There is no distension.     Palpations: Abdomen is soft. There is no mass.     Tenderness: There is no abdominal tenderness. There is no right CVA tenderness, left CVA tenderness, guarding or rebound.  Skin:    General: Skin is warm and dry.     Coloration: Skin is not pale.     Findings: No rash.  Neurological:     General: No focal deficit present.     Mental Status: She is alert and oriented to person, place, and time.  Psychiatric:        Mood and Affect: Mood normal.        Behavior: Behavior normal.        Thought Content: Thought content normal.  Judgment: Judgment normal.     Results for orders placed or performed during the hospital encounter of 07/16/19 (from the past 24 hour(s))  POCT urinalysis dip (device)     Status: Abnormal   Collection Time: 07/16/19  5:09 PM  Result Value Ref Range   Glucose, UA NEGATIVE NEGATIVE mg/dL   Bilirubin Urine NEGATIVE NEGATIVE   Ketones, ur NEGATIVE NEGATIVE mg/dL   Specific Gravity, Urine >=1.030 1.005 - 1.030   Hgb urine dipstick TRACE (A) NEGATIVE   pH 5.0 5.0 - 8.0   Protein, ur NEGATIVE NEGATIVE mg/dL   Urobilinogen, UA 0.2 0.0 - 1.0 mg/dL   Nitrite NEGATIVE NEGATIVE   Leukocytes,Ua SMALL (A) NEGATIVE  Pregnancy, urine POC     Status: None   Collection Time: 07/16/19  5:13 PM  Result Value Ref Range   Preg Test, Ur NEGATIVE NEGATIVE    Assessment and Plan :   1. Acute cystitis without hematuria   2. Urinary frequency   3. Dysuria      Will cover for cystitis with Macrobid, urine cultures pending.  Counseled patient on need to hydrate consistently every day, cut back on her coffee. Counseled patient on potential for adverse effects with medications prescribed/recommended today, ER and return-to-clinic precautions discussed, patient verbalized understanding.    Jaynee Eagles, [place] 07/16/19 2423

## 2019-07-19 LAB — URINE CULTURE: Culture: 60000 — AB

## 2019-07-20 ENCOUNTER — Telehealth (HOSPITAL_COMMUNITY): Payer: Self-pay | Admitting: Emergency Medicine

## 2019-07-20 MED ORDER — CEPHALEXIN 500 MG PO CAPS: 500.0000 mg | ORAL_CAPSULE | Freq: Two times a day (BID) | ORAL | 0 refills | Status: AC

## 2019-07-20 NOTE — Telephone Encounter (Signed)
Urine culture was positive for KLEBSIELLA PNEUMONIAE resistant to macrobid given at urgent care visit. Prescription for keflex  per Claiborne Billings PA sent to pharmacy of choice. Pt called and made aware. Pt educated to follow up if symptoms are not improving. Verbalized understanding.   Patient contacted and made aware of    results. Pt verbalized understanding and had all questions answered.

## 2019-08-25 ENCOUNTER — Other Ambulatory Visit: Payer: Self-pay | Admitting: Family Medicine

## 2019-08-25 DIAGNOSIS — Z3481 Encounter for supervision of other normal pregnancy, first trimester: Secondary | ICD-10-CM

## 2019-08-25 NOTE — Progress Notes (Signed)
New Ob labs ordered as future.

## 2019-08-26 ENCOUNTER — Other Ambulatory Visit: Payer: Self-pay

## 2019-08-26 DIAGNOSIS — Z3481 Encounter for supervision of other normal pregnancy, first trimester: Secondary | ICD-10-CM

## 2019-08-28 LAB — URINE CULTURE, OB REFLEX

## 2019-08-28 LAB — CULTURE, OB URINE

## 2019-08-31 LAB — OBSTETRIC PANEL, INCLUDING HIV
Antibody Screen: NEGATIVE
Basophils Absolute: 0 10*3/uL (ref 0.0–0.2)
Basos: 1 %
EOS (ABSOLUTE): 0.1 10*3/uL (ref 0.0–0.4)
Eos: 1 %
HIV Screen 4th Generation wRfx: NONREACTIVE
Hematocrit: 36 % (ref 34.0–46.6)
Hemoglobin: 12.1 g/dL (ref 11.1–15.9)
Hepatitis B Surface Ag: NEGATIVE
Immature Grans (Abs): 0 10*3/uL (ref 0.0–0.1)
Immature Granulocytes: 0 %
Lymphocytes Absolute: 1.2 10*3/uL (ref 0.7–3.1)
Lymphs: 21 %
MCH: 32.5 pg (ref 26.6–33.0)
MCHC: 33.6 g/dL (ref 31.5–35.7)
MCV: 97 fL (ref 79–97)
Monocytes Absolute: 0.3 10*3/uL (ref 0.1–0.9)
Monocytes: 6 %
Neutrophils Absolute: 4.1 10*3/uL (ref 1.4–7.0)
Neutrophils: 71 %
Platelets: 202 10*3/uL (ref 150–450)
RBC: 3.72 x10E6/uL — ABNORMAL LOW (ref 3.77–5.28)
RDW: 12 % (ref 11.7–15.4)
RPR Ser Ql: NONREACTIVE
Rh Factor: POSITIVE
Rubella Antibodies, IGG: 2.46 index (ref >0.99)
WBC: 5.7 10*3/uL (ref 3.4–10.8)

## 2019-08-31 LAB — HGB FRAC. W/SOLUBILITY
Hgb A2 Quant: 2.7 % (ref 1.8–3.2)
Hgb A: 97.3 % (ref 96.4–98.8)
Hgb C: 0 %
Hgb F Quant: 0 % (ref 0.0–2.0)
Hgb S: 0 %
Hgb Solubility: NEGATIVE
Hgb Variant: 0 %

## 2019-09-02 ENCOUNTER — Encounter: Payer: Self-pay | Admitting: Family Medicine

## 2019-09-02 ENCOUNTER — Other Ambulatory Visit: Payer: Self-pay

## 2019-09-02 ENCOUNTER — Ambulatory Visit (INDEPENDENT_AMBULATORY_CARE_PROVIDER_SITE_OTHER): Payer: Self-pay | Admitting: Family Medicine

## 2019-09-02 VITALS — BP 118/80 | HR 84 | Wt 152.4 lb

## 2019-09-02 DIAGNOSIS — Z3481 Encounter for supervision of other normal pregnancy, first trimester: Secondary | ICD-10-CM

## 2019-09-02 MED ORDER — INFLUENZA VAC SPLIT QUAD 0.5 ML IM SUSP: 0.5000 mL | Freq: Once | INTRAMUSCULAR | 0 refills | Status: AC

## 2019-09-02 NOTE — Progress Notes (Signed)
Rachael Jackson is a 33 y.o. yo [id number] at [place] who presents for her initial prenatal visit. Pregnancy  is notplanned but in long term relationship with FOB.  She reports morning sickness and nausea. She  isTaking PNV. See flow sheet for details.  Pt is happy about her pregnancy.  Pt complains of nausea and vomiting.   Last menstrual period reported was 06/25/2019 and was 5d in length. Periods are regular at 30d intervals.   No history of genital HSV in her or her partner.   Pt has had 3 prior pregnancies, all resulting in term vaginal deliveries w/o complications.  She did not have epidural anesthesia with any of her pregnancies.    Pt wants to breast feed and plans to use nexplenon for [facility] after delivery.    Pt thinks [facility]    She states she has applied for the adopt-a-mom program.   Pt does not desire genetic counseling or screening.    Pt desires flu shot.    PMH, POBH, FH, meds, allergies and Social Hx reviewed. Pt does not smoke, drink, or do drugs.    Prenatal exam: BP 118/80   Pulse 84   Wt 152 lb 6.4 oz (69.1 kg)   LMP 06/15/2019   BMI 26.16 kg/m  Gen: Well nourished, well developed.  No distress.  Vitals noted. HEENT: Normocephalic, atraumatic.  Neck supple without cervical lymphadenopathy, thyromegaly or thyroid nodules.  CV: RRR no murmur, gallops or rubs Lungs: CTA B.  Normal respiratory effort without wheezes or rales. Abd: soft, NTND. +BS.  Uterus not appreciated above pelvis.  Ext: No clubbing, cyanosis or edema. Psych: Normal grooming and dress.  Not depressed or anxious appearing.  Normal thought content and process without flight of ideas or looseness of associations   Assessment/plan: 1) Pregnancy Unknown doing well.  Current pregnancy issues include nausea and vomiting. Informed pt how to combine B6 and unisom.  Dating is reliable Prenatal labs reviewed, nothing abnormal. Bleeding and pain  precautions reviewed. Importance of prenatal vitamins reviewed.  Genetic screening offered.  Early glucola is not indicated.  Pt declines dating u/s d/t expense.  Dating by LMP is reliable.     Follow up 4 weeks.

## 2019-09-02 NOTE — Patient Instructions (Addendum)
It was nice to meet you today.    For your nausea and vomiting you can take 25mg  vitamin B6 and 12/5mg  (1/2 tablet) of unisom (doxylamine), which you can pick up over the counter at the pharmacy.  Take these together at night before going to bed.  If you are still having nausea and vomiting after taking this for three days you can start taking another dose in the morning too.    Please schedule an appointment for 4 weeks from now.    Thank you ,   Have a great day  Clemetine Marker MD

## 2019-09-04 NOTE — L&D Delivery Note (Signed)
Delivery Note At 4:52 AM, on April 10, 2020, a viable female "Rachael Jackson" was delivered via Vaginal, Spontaneous (Presentation: Left Occiput Anterior with restitution to LOT). Shoulders delivered easily and infant with good tone and spontaneous cry. Tactile stimulation given by provider and infant placed on mother's abdomen where nurse continued tactile stimulation. Infant  APGAR: 9, 9. After 2 minute delay, placental separation was noted and the umbilical cord was clamped and cut. Placenta delivered spontaneously via Para March and was noted to be intact with 3VC upon inspection. Umbilical blood was collected by provider. Vaginal inspection revealed no lacerations.  Fundus firm, at the umbilicus, but bleeding moderate.  Fundal massage with of clots out, but bleeding continued to be brisk.  Patient given of Fentanyl and manual explorations revealed ~142mL additional of clots.  Bleeding then small and patient given of cytotec via buccal.   Mother hemodynamically stable and infant skin to skin prior to provider exit.  Mother desires outpatient Nexplanon for birth control method and opts to breastfeed.  Infant weight at one hour of life:   Anesthesia: None Episiotomy: None Lacerations: None Suture Repair: N/A Est. Blood Loss (mL): 500  Mom to postpartum.  Baby to Couplet care / Skin to Skin.  Cherre Robins 04/10/2020, 5:58 AM

## 2019-09-06 ENCOUNTER — Encounter: Payer: Self-pay | Admitting: Family Medicine

## 2019-09-06 DIAGNOSIS — Z3493 Encounter for supervision of normal pregnancy, unspecified, third trimester: Secondary | ICD-10-CM | POA: Insufficient documentation

## 2019-09-06 DIAGNOSIS — Z3492 Encounter for supervision of normal pregnancy, unspecified, second trimester: Secondary | ICD-10-CM | POA: Insufficient documentation

## 2019-09-30 ENCOUNTER — Ambulatory Visit (INDEPENDENT_AMBULATORY_CARE_PROVIDER_SITE_OTHER): Payer: Self-pay | Admitting: Family Medicine

## 2019-09-30 ENCOUNTER — Other Ambulatory Visit: Payer: Self-pay

## 2019-09-30 DIAGNOSIS — Z3481 Encounter for supervision of other normal pregnancy, first trimester: Secondary | ICD-10-CM

## 2019-10-05 NOTE — Progress Notes (Signed)
   HPI 34 year old G4P3 who presents at [redacted]w[redacted]d for prenatal check. She has been doing well with her only issues being nausea and headaches. She states that she has had very bad nausea and vomiting during her previous pregnancies as well. It has improved a little recently. She has not been taking any medications for it, as she never picked up the diclegis. She states that with her other pregnancies her nausea has essentially resolved when entering her 2nd trimester.   Offered genetic testing at this visit but patient declines. Patient previously denied dating ultrasound stating cost, dating based on LMP which was felt to be accurate at initial ob appointment. Is interested in getting anatomy scan, but wants to check with adopt-a-mom.  Also of note the patient has not been having any light-headedness, dizziness, syncopal episodes, or any other symptoms consistent with low-blood pressure.  No abdominal pain, no bleeding, gushes of fluid, or any there alarm signs.  CC: prenatal care   ROS:   Review of Systems See HPI for ROS.   CC, SH/smoking status, and VS noted  Objective: BP (!) 88/52   Pulse 87   Wt 154 lb 4 oz (70 kg)   LMP 06/25/2019   BMI 26.48 kg/m  Gen: 34 year old female, no acute distress, comfortable, very pleasant HEENT: mmm, perrla CV: rrr, no m/r/g Resp: lungs clear to auscultation bilaterally, no wheezing, no accessory muscle use GU: 138-144BMP on fetal doppler Neuro: Alert and oriented, Speech clear, No gross deficits   Assessment and plan:  Supervision of normal pregnancy in first trimester Doing ok at this visit. Still having a good amount of nausea and has been having worse PO intake because of that. Lower blood pressure is likely related. Reassuring that she is having no symptoms aside from the daily headaches, which have been present for longer than that.  Looks to need GC/chlaymdia. Also can get set up for anatomy US at next appointment. Will set up in  faculty OB clinic. Discussed with Dr. Manson Passey who was in agreement. Recommended trying the nausea medication. Will need to call back if develops alarm symptoms which I discussed with patient.   No orders of the defined types were placed in this encounter.   No orders of the defined types were placed in this encounter.    Myrene Buddy MD PGY-3 Family Medicine Resident  10/05/2019 10:11 AM

## 2019-10-05 NOTE — Assessment & Plan Note (Addendum)
Doing ok at this visit. Still having a good amount of nausea and has been having worse PO intake because of that. Lower blood pressure is likely related. Reassuring that she is having no symptoms aside from the daily headaches, which have been present for longer than that.  Looks to need GC/chlaymdia. Also can get set up for anatomy US at next appointment. Will set up in faculty OB clinic. Discussed with Dr. Manson Passey who was in agreement. Recommended trying the nausea medication. Will need to call back if develops alarm symptoms which I discussed with patient.

## 2019-10-29 ENCOUNTER — Other Ambulatory Visit (HOSPITAL_COMMUNITY)
Admission: RE | Admit: 2019-10-29 | Discharge: 2019-10-29 | Disposition: A | Discharge status: Home / Self Care | Payer: Self-pay | Source: Ambulatory Visit | Attending: Family Medicine | Admitting: Family Medicine

## 2019-10-29 ENCOUNTER — Ambulatory Visit (INDEPENDENT_AMBULATORY_CARE_PROVIDER_SITE_OTHER): Payer: Self-pay | Admitting: Family Medicine

## 2019-10-29 ENCOUNTER — Other Ambulatory Visit: Payer: Self-pay

## 2019-10-29 DIAGNOSIS — Z3482 Encounter for supervision of other normal pregnancy, second trimester: Secondary | ICD-10-CM | POA: Insufficient documentation

## 2019-10-29 NOTE — Patient Instructions (Signed)
It was a pleasure to meet you today! We completed routine prenatal tests and scheduled your baby's anatomy ultrasound. It is scheduled for 11:30 am on Monday, March 1st.  If you have any questions of concerns please feel free to call the clinic and we will help you in any way we can.   Segundo trimestre de [employer] Trimester of Pregnancy  El segundo trimestre va desde la semana14 hasta la 42 (desde el mes 4 hasta el 6). Este suele ser el momento en el que mejor se siente. En general, las nuseas matutinas han disminuido o han desaparecido completamente. Tendr ms energa y podr aumentarle el apetito. El beb en gestacin se desarrolla rpidamente. Hacia el final del sexto mes, el beb mide aproximadamente 9 pulgadas (23 cm) y pesa alrededor de 1 libras (700 g). Es probable que sienta al beb [employer] 53 y 5 semanas del [place]. Siga estas indicaciones en su casa: Medicamentos  [employer] de venta libre y los recetados solamente como se lo haya indicado el mdico. Algunos medicamentos son seguros para tomar durante el Media planner y otros no lo son.  Tome vitaminas prenatales que contengan por lo menos [id number] (?g) de cido flico.  Si tiene dificultad para mover el intestino (estreimiento), tome un medicamento para ablandar las heces (laxante) si su mdico se lo autoriza. Comida y bebida   Ingiera alimentos saludables de [place] regular.  No coma carne cruda ni quesos sin cocinar.  Si obtiene poca cantidad de calcio de los alimentos que ingiere, consulte a su mdico sobre la posibilidad de tomar un suplemento diario de calcio.  Evite el consumo de alimentos ricos en grasas y azcares, como los alimentos fritos y los dulces.  Si tiene Higher education careers adviser (nuseas) o devuelve (vomita): ? Ingiera 4 o 5comidas pequeas por TEFL teacher de 3abundantes. ? Intente comer algunas galletitas saladas. ? Beba lquidos [employer], en lugar de [employer].  Para evitar el estreimiento: ? Consuma alimentos ricos en fibra, como frutas y verduras frescas, cereales integrales y frijoles. ? Beba suficiente lquido para mantener el pis (orina) claro o de color amarillo plido. Actividad  Haga ejercicios solamente como se lo haya indicado el mdico. Interrumpa la actividad fsica si comienza a tener calambres.  No haga ejercicio si hace demasiado calor, hay demasiada humedad o se encuentra en un lugar de mucha altura (altitud alta).  Evite levantar pesos [employer].  Use zapatos con tacones bajos. Mantenga una buena postura al sentarse y pararse.  Puede continuar teniendo [employer], a menos que el mdico le indique lo contrario. Alivio del dolor y del Tree surgeon  Use un sostn que le brinde buen soporte si sus mamas estn sensibles.  Dese baos de asiento con agua tibia para Best boy o las molestias causadas por las hemorroides. Use una crema para las hemorroides si el mdico la autoriza.  Descanse con las piernas elevadas si tiene calambres o dolor de cintura.  Si desarrolla venas hinchadas y abultadas (vrices) en las piernas: ? Use medias de compresin o medias de descanso como se lo haya indicado el mdico. ? Levante (eleve) los pies durante 54minutos, 3 o 4veces por Training and development officer. ? Limite el consumo de sal en sus alimentos. Cuidado prenatal  [place] sus preguntas. Llvelas cuando concurra a las visitas prenatales.  Concurra a todas las visitas prenatales como se lo haya indicado el mdico. Esto es importante. Seguridad  [employer] cinturn de seguridad cuando conduzca.  Sherilyn Cooter  una lista de los nmeros de telfono de Associate Professor, que [employer] nmeros de telfono de familiares, amigos, [facility], as como los departamentos de polica y bomberos. Instrucciones generales  Consulte a su mdico sobre los [employer] debe comer o pdale que la ayude a Clinical research associate a quien pueda aconsejarla si necesita ese  servicio.  Consulte a su mdico acerca de dnde se dictan clases prenatales cerca de donde vive. Comience las clases antes del mes 6 de embarazo.  No se d baos de inmersin en agua caliente, baos turcos ni saunas.  No se haga duchas vaginales ni use tampones o toallas higinicas perfumadas.  No mantenga las piernas cruzadas durante [place].  Vaya al dentista si an no lo hizo. Use un cepillo de cerdas suaves para cepillarse los dientes. Psese el hilo dental suavemente.  No fume, no consuma hierbas ni beba alcohol. No tome frmacos que el mdico no haya autorizado.  No consuma ningn producto que contenga nicotina o tabaco, como cigarrillos y Administrator, Civil Service. Si necesita ayuda para dejar de fumar, consulte al mdico.  Evite el contacto con las bandejas sanitarias de los gatos y la tierra que estos animales usan. Estos elementos contienen bacterias que pueden causar defectos congnitos al beb y la posible prdida del beb (aborto espontneo) o la muerte fetal. Comunquese con un mdico si:  Tiene clicos leves o siente presin en la parte baja del vientre.  Tiene dolor al hacer pis (orinar).  Advierte un lquido con olor ftido que proviene de la vagina.  Tiene Programme researcher, broadcasting/film/video (nuseas), devuelve (vomita) o tiene deposiciones acuosas (diarrea).  Sufre un dolor persistente en el abdomen.  Siente mareos. Solicite ayuda de inmediato si:  Tiene fiebre.  Tiene una prdida de lquido por la vagina.  Tiene sangrado o pequeas prdidas vaginales.  Siente dolor intenso o clicos en el abdomen.  Sube o baja de peso rpidamente.  Tiene dificultades para recuperar el aliento y siente dolor en el pecho.  Sbitamente se le hinchan mucho el rostro, las [place], los tobillos, los pies o las piernas.  No ha sentido los movimientos del beb durante Georgianne Fick.  Siente un dolor de cabeza intenso que no se alivia al tomar [employer].  Tiene dificultad para  ver. Resumen  El segundo trimestre va desde la semana14 hasta la 27, desde el mes 4 hasta el 6. Este suele ser el momento en el que mejor se siente.  Para cuidarse y cuidar a su beb en gestacin, debe comer alimentos saludables, tomar medicamentos solamente si su mdico le indica que lo haga y hacer actividades que sean seguras para usted y su beb.  Llame al mdico si se enferma o si nota algo inusual acerca de su embarazo. Tambin llame al mdico si necesita ayuda para saber qu alimentos debe comer o si quiere saber qu actividades puede realizar de forma segura. Esta informacin no tiene Theme park manager el consejo del mdico. Asegrese de hacerle al mdico cualquier pregunta que tenga. Document Revised: 05/15/2017 Document Reviewed: 05/15/2017 Elsevier Patient Education  2020 [employer].

## 2019-10-29 NOTE — Progress Notes (Signed)
Ciearra Rufo is a 34 y.o. G4P3003 at [redacted]w[redacted]d here for routine follow up. She is dated by LMP.  She reports no complaints.  She denies vaginal bleeding, LOF, contractions, See flow sheet for details.  Vitals:   10/29/19 1035  BP: 90/62  Pulse: 76   A/P: Pregnancy at [redacted]w[redacted]d.  Doing well.   . Dating reviewed, dating tab is correct . Fetal heart tones Appropriate . Problem list updated: Yes.  . Influenza vaccine encouraged, patient will get at health department, given handout . The patient has the following indication for screening preexisting diabetes: Reviewed indications for early 1 hour glucose testing, not indicated . Marland Kitchen Anatomy ultrasound ordered to be scheduled at 18-20 weeks. . Pregnancy education given (handout) . Bleeding and pain precautions reviewed. Marland Kitchen Gc/chlamydia obtained today . Completed release of info to get pap smear result from health department  . Pregnancy issues include: o Advanced maternal age - declined genetic counseling or testing o Soft, externally dilated cervix noted on pelvic exam today. Internal os not dilated. Suspect related to multiparous status. Check cervical length with anatomy ultrasound. Discussed with Dr. Jolayne Panther who is in agreement with this plan.  . Follow up 4 weeks.  Seen along with resident Dr. Nobie Putnam. Agree with above documentation and have edited in full.  Levert Feinstein, MD [facility]

## 2019-10-30 LAB — CERVICOVAGINAL ANCILLARY ONLY
Chlamydia: NEGATIVE
Comment: NEGATIVE
Comment: NORMAL
Neisseria Gonorrhea: NEGATIVE

## 2019-11-02 ENCOUNTER — Encounter: Payer: Self-pay | Admitting: Family Medicine

## 2019-11-02 ENCOUNTER — Telehealth: Payer: Self-pay | Admitting: *Deleted

## 2019-11-02 ENCOUNTER — Telehealth: Payer: Self-pay | Admitting: Family Medicine

## 2019-11-02 NOTE — Telephone Encounter (Signed)
Pt states someone called her for here, about 10 mins ago. Thanks

## 2019-11-02 NOTE — Telephone Encounter (Signed)
-----   Message from Latrelle Dodrill, MD sent at 11/02/2019  9:47 AM EST ----- Please let patient know her gonorrhea & chlamydia testing was negative.

## 2019-11-02 NOTE — Telephone Encounter (Signed)
Attempted to call pt but no answer. Will try again later. Arran Fessel, CMA  

## 2019-11-02 NOTE — Telephone Encounter (Signed)
Pt would also like for someone to call to discuss ultrasound results. Thanks

## 2019-11-03 NOTE — Telephone Encounter (Signed)
Called and informed patient that gonorrhea/chlamydia test was negative.  Patient appreciative but had another concern.  Patient called nurse line and LVM. Patient is concerned about her ultrasound results.  Patient states that she felt very uncomfortable and nervous because the ultrasound technician asked her several times if she was sure about the last period date.  Tried calming patient's fears that we would contact her as soon as the test is resulted. Patient was still concerned so I  informed her that often the technician  will ask questions and that this is nothing out of the norm.  Glennie Hawk, CMA

## 2019-11-04 NOTE — Telephone Encounter (Signed)
Obtained u/s report from Dr. Lonell Face box.  Ultrasound demonstrated: Dating is consistent with LMP (off by only 9 days, does not support re-dating pregnancy) Low lying placenta (0.5cm from os) Normal cervical length (4.3cm) with closed cervix  Unfortunately did not have an anatomy survey done, they treated this as a dating ultrasound. Will need an anatomy scan done and scheduled in a week or two.  I attempted to call patient to discuss these results, but she did not answer. LVM asking her to call back. Please contact me when she returns the call.  Latrelle Dodrill, MD

## 2019-11-05 NOTE — Telephone Encounter (Signed)
Called patient and spoke with her. Reviewed ultrasound results.  As cervix is <1cm from os, per OB attending Dr. Marice Potter, needs placenta previa precautions. I reviewed these with patient over the phone. Advised no sex, no orgasms, avoid moderate or strenuous exercise, heavy lifting, or standing for >4 hours at a time.  I will work on getting her a follow up anatomy scan ordered and scheduled tomorrow and told patient to expect a call. Patient very appreciative.  Latrelle Dodrill, MD

## 2019-11-05 NOTE — Telephone Encounter (Signed)
I don't know who tried to call this patient and I cannot see her u/s results in epic.  I haven't seen any u/s results in my physical box either so I cannot discuss this with her.

## 2019-11-06 NOTE — Telephone Encounter (Signed)
Per pt,Dr. Pollie Meyer called pt and gave results of [place]  Sunday Spillers, CMA

## 2019-11-06 NOTE — Telephone Encounter (Signed)
Anatomy ultrasound order form completed. Will return to [facility] RN team. RN team, can you coordinate scheduling her an appointment with the health department and contact patient with an appointment? Order form will also need to be faxed to [facility].  Ideally the appointment would be the week after next.  Thanks Latrelle Dodrill, MD

## 2019-11-09 NOTE — Telephone Encounter (Signed)
I actually spoke with a representative from [employer] on Friday and she told me she would work with the patient on the cost of this follow up ultrasound. She said she'd charge her just what the radiologist would charge to read the scan (around $50). Please let patient know it should be cheaper than $150 and she can discuss with the sonographer when she gets the scan.  Thanks! Latrelle Dodrill, MD

## 2019-11-09 NOTE — Telephone Encounter (Signed)
Anatomy US scheduled for 3/15. Patient called and informed. Patient voiced concerns about paying for more ultrasounds. Patient stated she was told she will need another [place] at 32 weeks. Patient is hoping she can wait to have anatomy scan done at 32 weeks as well, so she only has to pay for one. I advised her its important to have an anatomy scan of the baby and at appropriate gestational age. Will forward to PCP for additional advice.   I did tell her the [place] should not cost her more than 150 in the adopt a mom program, she seemed pleased about this.

## 2019-11-10 NOTE — Telephone Encounter (Signed)
LVM for patient to call me back 

## 2019-11-26 ENCOUNTER — Ambulatory Visit (INDEPENDENT_AMBULATORY_CARE_PROVIDER_SITE_OTHER): Payer: Self-pay | Admitting: Family Medicine

## 2019-11-26 ENCOUNTER — Other Ambulatory Visit: Payer: Self-pay

## 2019-11-26 VITALS — BP 100/62 | HR 84 | Wt 165.8 lb

## 2019-11-26 DIAGNOSIS — Z3482 Encounter for supervision of other normal pregnancy, second trimester: Secondary | ICD-10-CM

## 2019-11-26 NOTE — Patient Instructions (Signed)
It was great seeing you again today!  Everything looks great with baby's heart rate and measurement.  Please let me know that your ultrasounds are done to help department.  I have not gotten the most recent one but I was able to see the one from a few weeks ago.  We will see back in about 4 weeks for a 1 hour Glucola test.  I will schedule your appointment for the morning as early as possible.

## 2019-11-27 ENCOUNTER — Encounter: Payer: Self-pay | Admitting: Family Medicine

## 2019-11-29 NOTE — Progress Notes (Signed)
  [facility] Prenatal Visit  Rachael Jackson is a 34 y.o. [phone] at [redacted]w[redacted]d here for routine follow up. She is dated by early ultrasound.  She reports no complaints.  She reports good fetal movement. No bleeding, loss of fluid, contractions. See flow sheet for details. Vitals:   11/26/19 1457  BP: 100/62  Pulse: 84     A/P: Pregnancy at [redacted]w[redacted]d.  Doing well.   . Dating reviewed, dating tab is correct . Fetal heart tones Appropriate, ranging from 141-150 . Fundal height within expected range. , 23 cm measured . Pregnancy issues include none. . Anatomy ultrasound reviewed and notable for low-lying placenta.  Patient had repeat at health department on 3/22, records not received yet.  Per her report she is to follow-up at 32 weeks for another ultrasound. . Problem list updated Yes.  . Influenza vaccine not administered as patient declined, will continue to discuss. .  . Indications for screening for preexisting diabetes include: Reviewed indications for early 1 hour glucose testing, not indicated .  Marland Kitchen Pregnancy education provided on the following topics: fetal growth and movement, ultrasound assessment, and upcoming laboratory assessment.   . Patient was already seen in second trimester by faculty OB clinic on 10/29/2019. . Preterm labor precautions given.   Patient to follow-up in 4 weeks for 1 hour glucose test and 26-week visit  Myrene Buddy MD PGY-3 Family Medicine Resident

## 2019-12-07 ENCOUNTER — Telehealth: Payer: Self-pay

## 2019-12-07 NOTE — Telephone Encounter (Signed)
Patient LVM on nurse line requesting advice on OTC allergy medication. Patient also requesting results from recent US. Looks like she should have gotten one done by the health department, I did not see an order. Will reach out to [facility] to see if a [facility] [place] for patient has been received.

## 2019-12-07 NOTE — Telephone Encounter (Signed)
Ultrasound report put in PCP box.

## 2019-12-08 ENCOUNTER — Other Ambulatory Visit: Payer: Self-pay | Admitting: Family Medicine

## 2019-12-08 ENCOUNTER — Telehealth: Payer: Self-pay | Admitting: Family Medicine

## 2019-12-08 DIAGNOSIS — R519 Headache, unspecified: Secondary | ICD-10-CM

## 2019-12-08 MED ORDER — CETIRIZINE HCL 10 MG PO TABS: 10.0000 mg | ORAL_TABLET | Freq: Every day | ORAL | 1 refills | Status: AC

## 2019-12-08 NOTE — Telephone Encounter (Signed)
I don't see it in my box.

## 2019-12-08 NOTE — Telephone Encounter (Signed)
Can you let the patient know that I do not have the most recent u/s report yet? The one that was placed in my box was the old one from 3/1.    Also, please let her know I have renewed her prescription for zyrtec and that this is safe to take in pregnancy.

## 2019-12-08 NOTE — Telephone Encounter (Signed)
I've got it now.

## 2019-12-09 ENCOUNTER — Telehealth: Payer: Self-pay | Admitting: Family Medicine

## 2019-12-09 ENCOUNTER — Encounter: Payer: Self-pay | Admitting: Family Medicine

## 2019-12-09 MED FILL — ?CETIRIZINE HCL 10 MG TABLE: 10 | 30 days supply | Qty: 30 | Fill #0

## 2019-12-09 NOTE — Telephone Encounter (Addendum)
Called patient to confirm dating. The patient speaks Spanish as their primary language.  An interpreter was used for the entire visit.   LMP in ED on 06/29/19 was reported to be 06/15/19.  LMP at first office visit was 06/25/2019.  Patient unsure of LMP and does not keep log of menses. Reports possible irregularities this month. Given two ultrasounds dating her at 04/09/2020, will go by  Earliest ultrasound. All questions answered, patient understanding and appreciative.   Will update dating tab.  Terisa Starr, MD  Family Medicine Teaching Service

## 2019-12-09 NOTE — Telephone Encounter (Signed)
Faxed request to [facility] for updated EFW given dating discrepancy. EDD by two ultrasounds is 04/09/20.     Terisa Starr, MD  Family Medicine Teaching Service

## 2019-12-09 NOTE — Telephone Encounter (Addendum)
Received ultrasound.   Anatomy scan complete, female, no abnormalities. NO placenta abnormalities- specifically previa/low lying placenta NOT present.  Anterior placenta, normal cervical length (transabdominal).   EFW by [place] 9.2%. Will need to confirm dating. Will call patient later today.  Terisa Starr, MD  Family Medicine Teaching Service

## 2019-12-09 NOTE — Telephone Encounter (Signed)
Called health department---they will fax most recent ultrasound which was 3/15 per their records.  Terisa Starr, MD  Family Medicine Teaching Service

## 2019-12-10 ENCOUNTER — Telehealth: Payer: Self-pay | Admitting: Family Medicine

## 2019-12-10 NOTE — Telephone Encounter (Signed)
Called [facility]. They will send along new ultrasound report with updated EFW % based upon appropriate dating.  Terisa Starr, MD  Family Medicine Teaching Service

## 2019-12-24 ENCOUNTER — Ambulatory Visit (INDEPENDENT_AMBULATORY_CARE_PROVIDER_SITE_OTHER): Payer: Self-pay | Admitting: Family Medicine

## 2019-12-24 ENCOUNTER — Other Ambulatory Visit (INDEPENDENT_AMBULATORY_CARE_PROVIDER_SITE_OTHER): Payer: Self-pay

## 2019-12-24 ENCOUNTER — Other Ambulatory Visit: Payer: Self-pay

## 2019-12-24 VITALS — BP 100/60 | HR 72 | Wt 166.5 lb

## 2019-12-24 DIAGNOSIS — Z3482 Encounter for supervision of other normal pregnancy, second trimester: Secondary | ICD-10-CM

## 2019-12-24 LAB — POCT 1 HR PRENATAL GLUCOSE: Glucose 1 Hr Prenatal, POC: 137 mg/dL

## 2019-12-24 NOTE — Progress Notes (Signed)
   Kameelah Minish is a 34 y.o. G4P3003 at [redacted]w[redacted]d here for routine follow up. Dated by early [place]. No complaints today. She reports no loss of fluid, contractions, vaginal bleeding and baby is moving well. See flow sheet for details.  Vitals:   12/24/19 0908  BP: 100/60  Pulse: 72  FHR: 135 bpm Fundal Height: 26 cm  A/P: Pregnancy at [redacted]w[redacted]d.  Doing well.   Pregnancy issues include: Abnormal 1 hour Glucola. Patient to return for 3 hour glucola. Preterm labor and fetal movement precautions reviewed.  Advanced maternal age.   Continue routine prenatal care. Will need TDap at follow up visit.  Follow up 2 weeks.  [place] Tabius Rood, DO PGY-3, [facility]

## 2019-12-24 NOTE — Patient Instructions (Addendum)
Thank you for coming to see me today. It was a pleasure! Today we talked about:   If you have any contractions which occur 7-10 minutes apart, vaginal bleeding, fluid leaking, or are worried that baby is not moving well, go immediately to [facility] to be evaluated.  Please follow-up with [place] in 4 weeks or sooner or as needed.  If you have any questions or concerns, please do not hesitate to call the office at [phone].  Take Care,   [place] Husayn Reim, DO  Second Trimester of Pregnancy  The second trimester is from week 14 through week 27 (month 4 through 6). This is often the time in pregnancy that you feel your best. Often times, morning sickness has lessened or quit. You may have more energy, and you may get hungry more often. Your unborn baby is growing rapidly. At the end of the sixth month, he or she is about 9 inches long and weighs about 1 pounds. You will likely feel the baby move between 18 and 20 weeks of pregnancy. Follow these instructions at home: Medicines  Take over-the-counter and prescription medicines only as told by your doctor. Some medicines are safe and some medicines are not safe during pregnancy.  Take a prenatal vitamin that contains at least 600 micrograms (mcg) of folic acid.  If you have trouble pooping (constipation), take medicine that will make your stool soft (stool softener) if your doctor approves. Eating and drinking   Eat regular, healthy meals.  Avoid raw meat and uncooked cheese.  If you get low calcium from the food you eat, talk to your doctor about taking a daily calcium supplement.  Avoid foods that are high in fat and sugars, such as fried and sweet foods.  If you feel sick to your stomach (nauseous) or throw up (vomit): ? Eat 4 or 5 small meals a day instead of 3 large meals. ? Try eating a few soda crackers. ? Drink liquids between meals instead of during meals.  To prevent constipation: ? Eat foods that are high in fiber,  like fresh fruits and vegetables, whole grains, and beans. ? Drink enough fluids to keep your pee (urine) clear or pale yellow. Activity  Exercise only as told by your doctor. Stop exercising if you start to have cramps.  Do not exercise if it is too hot, too humid, or if you are in a place of great height (high altitude).  Avoid heavy lifting.  Wear low-heeled shoes. Sit and stand up straight.  You can continue to have sex unless your doctor tells you not to. Relieving pain and discomfort  Wear a good support bra if your breasts are tender.  Take warm water baths (sitz baths) to soothe pain or discomfort caused by hemorrhoids. Use hemorrhoid cream if your doctor approves.  Rest with your legs raised if you have leg cramps or low back pain.  If you develop puffy, bulging veins (varicose veins) in your legs: ? Wear support hose or compression stockings as told by your doctor. ? Raise (elevate) your feet for 15 minutes, 3-4 times a day. ? Limit salt in your food. Prenatal care  Write down your questions. Take them to your prenatal visits.  Keep all your prenatal visits as told by your doctor. This is important. Safety  Wear your seat belt when driving.  Make a list of emergency phone numbers, including numbers for family, friends, the hospital, and police and fire departments. General instructions  Ask your doctor  about the right foods to eat or for help finding a counselor, if you need these services.  Ask your doctor about local prenatal classes. Begin classes before month 6 of your pregnancy.  Do not use hot tubs, steam rooms, or saunas.  Do not douche or use tampons or scented sanitary pads.  Do not cross your legs for long periods of time.  Visit your dentist if you have not done so. Use a soft toothbrush to brush your teeth. Floss gently.  Avoid all smoking, herbs, and alcohol. Avoid drugs that are not approved by your doctor.  Do not use any products that  contain nicotine or tobacco, such as cigarettes and e-cigarettes. If you need help quitting, ask your doctor.  Avoid cat litter boxes and soil used by cats. These carry germs that can cause birth defects in the baby and can cause a loss of your baby (miscarriage) or stillbirth. Contact a doctor if:  You have mild cramps or pressure in your lower belly.  You have pain when you pee (urinate).  You have bad smelling fluid coming from your vagina.  You continue to feel sick to your stomach (nauseous), throw up (vomit), or have watery poop (diarrhea).  You have a nagging pain in your belly area.  You feel dizzy. Get help right away if:  You have a fever.  You are leaking fluid from your vagina.  You have spotting or bleeding from your vagina.  You have severe belly cramping or pain.  You lose or gain weight rapidly.  You have trouble catching your breath and have chest pain.  You notice sudden or extreme puffiness (swelling) of your face, hands, ankles, feet, or legs.  You have not felt the baby move in over an hour.  You have severe headaches that do not go away when you take medicine.  You have trouble seeing. Summary  The second trimester is from week 14 through week 27 (months 4 through 6). This is often the time in pregnancy that you feel your best.  To take care of yourself and your unborn baby, you will need to eat healthy meals, take medicines only if your doctor tells you to do so, and do activities that are safe for you and your baby.  Call your doctor if you get sick or if you notice anything unusual about your pregnancy. Also, call your doctor if you need help with the right food to eat, or if you want to know what activities are safe for you. This information is not intended to replace advice given to you by your health care provider. Make sure you discuss any questions you have with your health care provider. Document Revised: 12/12/2018 Document Reviewed:  09/25/2016 Elsevier Patient Education  [place].

## 2019-12-25 ENCOUNTER — Other Ambulatory Visit (INDEPENDENT_AMBULATORY_CARE_PROVIDER_SITE_OTHER): Payer: Self-pay

## 2019-12-25 ENCOUNTER — Other Ambulatory Visit: Payer: Self-pay

## 2019-12-25 DIAGNOSIS — Z3482 Encounter for supervision of other normal pregnancy, second trimester: Secondary | ICD-10-CM

## 2019-12-25 LAB — POCT CBG (FASTING - GLUCOSE)-MANUAL ENTRY: Glucose Fasting, POC: 90 mg/dL (ref 70–99)

## 2019-12-26 LAB — GESTATIONAL GLUCOSE TOLERANCE
Glucose, Fasting: 77 mg/dL (ref 65–94)
Glucose, GTT - 1 Hour: 138 mg/dL (ref 65–179)
Glucose, GTT - 2 Hour: 95 mg/dL (ref 65–154)
Glucose, GTT - 3 Hour: 108 mg/dL (ref 65–139)

## 2019-12-28 ENCOUNTER — Other Ambulatory Visit: Payer: Self-pay

## 2019-12-31 ENCOUNTER — Telehealth: Payer: Self-pay | Admitting: Family Medicine

## 2019-12-31 NOTE — Telephone Encounter (Signed)
Patient is calling to check on the status of receiving her results from her last appointment 12/25/19. She would like for someone to call her when results are available.   The best cb number is [phone]

## 2019-12-31 NOTE — Telephone Encounter (Signed)
Dr. Talbert Forest saw.  Dr. Constance Goltz patient.  I will clarify who will call with results. I expect one to call today.

## 2020-01-01 NOTE — Telephone Encounter (Signed)
Called and discussed normal 3 hour glucola testing with patient. All question answered and no concerns at this time.

## 2020-01-20 ENCOUNTER — Ambulatory Visit (INDEPENDENT_AMBULATORY_CARE_PROVIDER_SITE_OTHER): Payer: Self-pay | Admitting: Family Medicine

## 2020-01-20 ENCOUNTER — Other Ambulatory Visit: Payer: Self-pay

## 2020-01-20 ENCOUNTER — Encounter: Payer: Self-pay | Admitting: Family Medicine

## 2020-01-20 VITALS — BP 104/60 | HR 91 | Ht 64.0 in | Wt 172.0 lb

## 2020-01-20 DIAGNOSIS — O99013 Anemia complicating pregnancy, third trimester: Secondary | ICD-10-CM

## 2020-01-20 DIAGNOSIS — Z3482 Encounter for supervision of other normal pregnancy, second trimester: Secondary | ICD-10-CM

## 2020-01-20 DIAGNOSIS — O09523 Supervision of elderly multigravida, third trimester: Secondary | ICD-10-CM | POA: Insufficient documentation

## 2020-01-20 NOTE — Progress Notes (Signed)
  [facility] Prenatal Visit  Rachael Jackson is a 34 y.o. [phone] at [redacted]w[redacted]d here for routine follow up. She is dated by early ultrasound.  She reports no bleeding, no contractions, no leaking and has some pelvic pain. Feels as if this baby sits lower in her pelvis. She reports fetal movement. She denies vaginal bleeding, contractions, or loss of fluid. See flow sheet for details.  Vitals:   01/20/20 0856  BP: 104/60  Pulse: 91  SpO2: 99%   A/P: Pregnancy at [redacted]w[redacted]d.  Doing well.    . Dating reviewed, dating tab is correct . Fetal heart tones Appropriate . Fundal height within expected range.  . Pregnancy issues include AMA. . Problem list updated Yes.  . Infant feeding choice: Breastfeeding . Contraception choice: Nexplanon  . Infant circumcision desired not applicable (Girl)  . The patient does not have a history of Cesarean delivery and no referral to Center for [facility] is indicated . Influenza vaccine previously administered. At [facility] around April will get record release form signed today.  . Tdap was not given today. [facility] will give.  . 1 hour glucola, CBC, RPR, and HIV were not obtained today.   . Pregnancy medical home and PHQ-9 forms were done today and reviewed.   . Rh status was reviewed and patient does not need Rhogam.  Rhogam was not given today.   . Childbirth and education classes were offered. . Pregnancy education regarding benefits of breastfeeding, contraception, fetal growth, expected weight gain, and safe infant sleep were discussed.  . Preterm labor and fetal movement precautions reviewed. . Seen in [facility] clinic 10/29/2019  Follow up 2 weeks.

## 2020-01-21 LAB — CBC
Hematocrit: 31.2 % — ABNORMAL LOW (ref 34.0–46.6)
Hemoglobin: 10.3 g/dL — ABNORMAL LOW (ref 11.1–15.9)
MCH: 31.6 pg (ref 26.6–33.0)
MCHC: 33 g/dL (ref 31.5–35.7)
MCV: 96 fL (ref 79–97)
Platelets: 198 10*3/uL (ref 150–450)
RBC: 3.26 x10E6/uL — ABNORMAL LOW (ref 3.77–5.28)
RDW: 12.6 % (ref 11.7–15.4)
WBC: 7.6 10*3/uL (ref 3.4–10.8)

## 2020-01-21 MED ORDER — IRON (FERROUS SULFATE) 325 (65 FE) MG PO TABS: 1.0000 | ORAL_TABLET | Freq: Every day | ORAL | 1 refills | Status: DC

## 2020-01-21 MED FILL — FERROUS SULFATE 325 MG TAB: 325 (65 FE) | 30 days supply | Qty: 30 | Fill #0

## 2020-02-05 ENCOUNTER — Other Ambulatory Visit: Payer: Self-pay

## 2020-02-05 ENCOUNTER — Ambulatory Visit (INDEPENDENT_AMBULATORY_CARE_PROVIDER_SITE_OTHER): Payer: Self-pay | Admitting: Family Medicine

## 2020-02-05 VITALS — BP 94/52 | HR 94 | Wt 175.2 lb

## 2020-02-05 DIAGNOSIS — Z3493 Encounter for supervision of normal pregnancy, unspecified, third trimester: Secondary | ICD-10-CM

## 2020-02-05 NOTE — Patient Instructions (Signed)
It was nice to see you today,  Everything looked good on your exam.  Baby's heart rate was normal.  Your next follow-up will be in 2 weeks.  Continue taking your prenatal and your iron supplement.   Third Trimester of Pregnancy  The third trimester is from week 28 through week 40 (months 7 through 9). This trimester is when your unborn baby (fetus) is growing very fast. At the end of the ninth month, the unborn baby is about 20 inches in length. It weighs about 6-10 pounds. Follow these instructions at home: Medicines  Take over-the-counter and prescription medicines only as told by your doctor. Some medicines are safe and some medicines are not safe during pregnancy.  Take a prenatal vitamin that contains at least 600 micrograms (mcg) of folic acid.  If you have trouble pooping (constipation), take medicine that will make your stool soft (stool softener) if your doctor approves. Eating and drinking   Eat regular, healthy meals.  Avoid raw meat and uncooked cheese.  If you get low calcium from the food you eat, talk to your doctor about taking a daily calcium supplement.  Eat four or five small meals rather than three large meals a day.  Avoid foods that are high in fat and sugars, such as fried and sweet foods.  To prevent constipation: ? Eat foods that are high in fiber, like fresh fruits and vegetables, whole grains, and beans. ? Drink enough fluids to keep your pee (urine) clear or pale yellow. Activity  Exercise only as told by your doctor. Stop exercising if you start to have cramps.  Avoid heavy lifting, wear low heels, and sit up straight.  Do not exercise if it is too hot, too humid, or if you are in a place of great height (high altitude).  You may continue to have sex unless your doctor tells you not to. Relieving pain and discomfort  Wear a good support bra if your breasts are tender.  Take frequent breaks and rest with your legs raised if you have leg cramps  or low back pain.  Take warm water baths (sitz baths) to soothe pain or discomfort caused by hemorrhoids. Use hemorrhoid cream if your doctor approves.  If you develop puffy, bulging veins (varicose veins) in your legs: ? Wear support hose or compression stockings as told by your doctor. ? Raise (elevate) your feet for 15 minutes, 3-4 times a day. ? Limit salt in your food. Safety  Wear your seat belt when driving.  Make a list of emergency phone numbers, including numbers for family, friends, the hospital, and police and fire departments. Preparing for your baby's arrival To prepare for the arrival of your baby:  Take prenatal classes.  Practice driving to the hospital.  Visit the hospital and tour the maternity area.  Talk to your work about taking leave once the baby comes.  Pack your hospital bag.  Prepare the baby's room.  Go to your doctor visits.  Buy a rear-facing car seat. Learn how to install it in your car. General instructions  Do not use hot tubs, steam rooms, or saunas.  Do not use any products that contain nicotine or tobacco, such as cigarettes and e-cigarettes. If you need help quitting, ask your doctor.  Do not drink alcohol.  Do not douche or use tampons or scented sanitary pads.  Do not cross your legs for long periods of time.  Do not travel for long distances unless you must. Only do  so if your doctor says it is okay.  Visit your dentist if you have not gone during your pregnancy. Use a soft toothbrush to brush your teeth. Be gentle when you floss.  Avoid cat litter boxes and soil used by cats. These carry germs that can cause birth defects in the baby and can cause a loss of your baby (miscarriage) or stillbirth.  Keep all your prenatal visits as told by your doctor. This is important. Contact a doctor if:  You are not sure if you are in labor or if your water has broken.  You are dizzy.  You have mild cramps or pressure in your lower  belly.  You have a nagging pain in your belly area.  You continue to feel sick to your stomach, you throw up, or you have watery poop.  You have bad smelling fluid coming from your vagina.  You have pain when you pee. Get help right away if:  You have a fever.  You are leaking fluid from your vagina.  You are spotting or bleeding from your vagina.  You have severe belly cramps or pain.  You lose or gain weight quickly.  You have trouble catching your breath and have chest pain.  You notice sudden or extreme puffiness (swelling) of your face, hands, ankles, feet, or legs.  You have not felt the baby move in over an hour.  You have severe headaches that do not go away with medicine.  You have trouble seeing.  You are leaking, or you are having a gush of fluid, from your vagina before you are 37 weeks.  You have regular belly spasms (contractions) before you are 37 weeks. Summary  The third trimester is from week 28 through week 40 (months 7 through 9). This time is when your unborn baby is growing very fast.  Follow your doctor's advice about medicine, food, and activity.  Get ready for the arrival of your baby by taking prenatal classes, getting all the baby items ready, preparing the baby's room, and visiting your doctor to be checked.  Get help right away if you are bleeding from your vagina, or you have chest pain and trouble catching your breath, or if you have not felt your baby move in over an hour. This information is not intended to replace advice given to you by your health care provider. Make sure you discuss any questions you have with your health care provider. Document Revised: 12/11/2018 Document Reviewed: 09/25/2016 Elsevier Patient Education  [place].

## 2020-02-05 NOTE — Progress Notes (Signed)
Rachael Jackson is a 34 y.o. G 4 P 3 at [redacted]w[redacted]d for routine follow up.  She reports no concerns at this time.  She denies vaginal bleeding, loss of fluid, regular contractions.  She felt baby move recently .  See flow sheet for details.  BP (!) 94/52   Pulse 94   Wt 175 lb 3.2 oz (79.5 kg)   LMP 06/25/2019   BMI 30.07 kg/m  Fetal heart rate 145 bpm.  Fundal height 30 cm General: Alert and oriented.  Gravid abdomen  A/P: Pregnancy at [redacted]w[redacted]d.  Doing well.   Pregnancy issues include: -Anemia: Patient was prescribed iron, which she takes daily.    Taking prenatal vitamin and iron.     Infant feeding choice: breast feeding Contraception choice: thinking of nexplenon. Infant circumcision desired not applicable  Tdapwas not given today.  This was given by the health department after her last visit. GBS/GC/CZ testing was not performed today.  Preterm labor precautions reviewed. Safe sleep discussed. Kick counts reviewed. Follow up 2 weeks.

## 2020-02-18 NOTE — Progress Notes (Signed)
  [facility] Prenatal Visit  Rachael Jackson is a 34 y.o. [phone] at [redacted]w[redacted]d here for routine follow up. She is dated by early ultrasound.  She reports backache and Pelvic pain.  She reports fetal movement. She denies vaginal bleeding, contractions, or loss of fluid.  See flow sheet for details.  Patient having worsening low back and pelvic pain since her last visit.  She has been having difficulty sleeping the last few nights because of the pain.  She is not taking anything for pelvic pain at the moment.  Vitals:   02/19/20 1030  BP: 110/60  Pulse: 92     A/P: Pregnancy at [redacted]w[redacted]d.  Doing well.   1. Routine prenatal care:  Marland Kitchen Dating reviewed, dating tab is correct . Fetal heart tones: Appropriate . Fundal height: within expected range.  . The patient does not have a history of HSV and valacyclovir is not at this time.  . The patient does not have a history of Cesarean delivery and no referral to Center for [facility] is indicated . Infant feeding choice: Breastfeeding . Contraception choice: Nexplanon  . Infant circumcision desired not applicable . Influenza vaccine not administered as not influenza season.   . Tdap was not given today. . Childbirth and education classes were not offered. . Pregnancy education regarding benefits of breastfeeding, contraception, fetal growth, expected weight gain, and safe infant sleep were discussed.  . Preterm labor and fetal movement precautions reviewed.   2. Pregnancy issues include the following and were addressed as appropriate today: . Back and pelvic pain.  Patient not taking anything at all currently.  Has gotten worse since last time and is interfering with her ability to sleep.  Prescribed extended release acetaminophen 650 mg advised patient to take at night before bed. . Problem list and pregnancy box updated: Yes.   Patient has already been scheduled for Ob Faculty clinic in third trimester   Follow up 2  weeks.

## 2020-02-19 ENCOUNTER — Other Ambulatory Visit: Payer: Self-pay

## 2020-02-19 ENCOUNTER — Ambulatory Visit (INDEPENDENT_AMBULATORY_CARE_PROVIDER_SITE_OTHER): Payer: Self-pay | Admitting: Family Medicine

## 2020-02-19 DIAGNOSIS — O99013 Anemia complicating pregnancy, third trimester: Secondary | ICD-10-CM

## 2020-02-19 DIAGNOSIS — Z3483 Encounter for supervision of other normal pregnancy, third trimester: Secondary | ICD-10-CM

## 2020-02-19 MED ORDER — IRON (FERROUS SULFATE) 325 (65 FE) MG PO TABS: 1.0000 | ORAL_TABLET | Freq: Every day | ORAL | 1 refills | Status: AC

## 2020-02-19 MED ORDER — ACETAMINOPHEN ER 650 MG PO TBCR: 650.0000 mg | EXTENDED_RELEASE_TABLET | Freq: Three times a day (TID) | ORAL | 1 refills | Status: DC | PRN

## 2020-02-19 MED FILL — FERROUS SULFATE 325 MG TAB: 325 (65 FE) | 30 days supply | Qty: 30 | Fill #0

## 2020-02-19 NOTE — Patient Instructions (Addendum)
Please take one tablet of the extended release tylenol before bedtime to help with sleep.    Please make an appointment for 2 weeks from now.     Back Pain in Pregnancy Back pain during pregnancy is common. Back pain may be caused by several factors that are related to changes during your pregnancy. Follow these instructions at home: Managing pain, stiffness, and swelling      If directed, for sudden (acute) back pain, put ice on the painful area. ? Put ice in a plastic bag. ? Place a towel between your skin and the bag. ? Leave the ice on for 20 minutes, 2-3 times per day.  If directed, apply heat to the affected area before you exercise. Use the heat source that your health care provider recommends, such as a moist heat pack or a heating pad. ? Place a towel between your skin and the heat source. ? Leave the heat on for 20-30 minutes. ? Remove the heat if your skin turns bright red. This is especially important if you are unable to feel pain, heat, or cold. You may have a greater risk of getting burned.  If directed, massage the affected area. Activity  Exercise as told by your health care provider. Gentle exercise is the best way to prevent or manage back pain.  Listen to your body when lifting. If lifting hurts, ask for help or bend your knees. This uses your leg muscles instead of your back muscles.  Squat down when picking up something from the floor. Do not bend over.  Only use bed rest for short periods as told by your health care provider. Bed rest should only be used for the most severe episodes of back pain. Standing, sitting, and lying down  Do not stand in one place for long periods of time.  Use good posture when sitting. Make sure your head rests over your shoulders and is not hanging forward. Use a pillow on your lower back if necessary.  Try sleeping on your side, preferably the left side, with a pregnancy support pillow or 1-2 regular pillows between your  legs. ? If you have back pain after a night's rest, your bed may be too soft. ? A firm mattress may provide more support for your back during pregnancy. General instructions  Do not wear high heels.  Eat a healthy diet. Try to gain weight within your health care provider's recommendations.  Use a maternity girdle, elastic sling, or back brace as told by your health care provider.  Take over-the-counter and prescription medicines only as told by your health care provider.  Work with a physical therapist or massage therapist to find ways to manage back pain. Acupuncture or massage therapy may be helpful.  Keep all follow-up visits as told by your health care provider. This is important. Contact a health care provider if:  Your back pain interferes with your daily activities.  You have increasing pain in other parts of your body. Get help right away if:  You develop numbness, tingling, weakness, or problems with the use of your arms or legs.  You develop severe back pain that is not controlled with medicine.  You have a change in bowel or bladder control.  You develop shortness of breath, dizziness, or you faint.  You develop nausea, vomiting, or sweating.  You have back pain that is a rhythmic, cramping pain similar to labor pains. Labor pain is usually 1-2 minutes apart, lasts for about 1 minute, and  involves a bearing down feeling or pressure in your pelvis.  You have back pain and your water breaks or you have vaginal bleeding.  You have back pain or numbness that travels down your leg.  Your back pain developed after you fell.  You develop pain on one side of your back.  You see blood in your urine.  You develop skin blisters in the area of your back pain. Summary  Back pain may be caused by several factors that are related to changes during your pregnancy.  Follow instructions as told by your health care provider for managing pain, stiffness, and  swelling.  Exercise as told by your health care provider. Gentle exercise is the best way to prevent or manage back pain.  Take over-the-counter and prescription medicines only as told by your health care provider.  Keep all follow-up visits as told by your health care provider. This is important. This information is not intended to replace advice given to you by your health care provider. Make sure you discuss any questions you have with your health care provider. Document Revised: 12/09/2018 Document Reviewed: 02/05/2018 Elsevier Patient Education  2020 [employer].   Dolor de espalda durante el embarazo Back Pain in Pregnancy El dolor de espalda es habitual durante el [place]. Puede deberse a varios factores relacionados con los cambios durante esta etapa. Siga estas indicaciones en su casa: Control del dolor, el entumecimiento y la hinchazn      Si se lo indican, para el dolor de espalda repentino (agudo), aplique hielo en la zona dolorida. ? Ponga el hielo en una bolsa plstica. ? Coloque una [employer] piel y Copy. ? Coloque el hielo durante , 2a3veces al da.  Si se lo indican, aplique calor en la zona afectada antes de realizar ejercicios. Use la fuente de calor que el mdico le recomiende, como una compresa de calor hmedo o una almohadilla trmica. ? Coloque una [employer] piel y la fuente de Airline pilot. ? Aplique calor durante 20 a . ? Retire la fuente de calor si la piel se pone de color rojo brillante. Esto es especialmente importante si no puede sentir dolor, calor o fro. Puede correr un riesgo mayor de sufrir quemaduras.  Si se lo indican, aplique un masaje en la zona afectada. Actividad  Haga ejercicio como se lo haya indicado el mdico. Hacer actividad fsica suave es la mejor forma de evitar o controlar el dolor de espalda.  Prstele atencin a su cuerpo cuando se levante. Si siente dolor al levantarse, pida ayuda o flexione  las rodillas. [employer], se usan los msculos de las piernas en lugar de los de la espalda.  Pngase en cuclillas al levantar algo del suelo. No se agache.  Haga reposo en cama nicamente por perodos breves como se lo haya indicado el mdico. El reposo en cama solo debe hacerse cuando los episodios de dolor de espalda son ms intensos. Pararse, sentarse y acostarse  No permanezca sentada o de pie en el mismo lugar durante largos perodos.  Cuando est sentada, adopte una Education officer, museum. Asegrese de que su cabeza descanse sobre sus hombros y no est colgando hacia delante. Use una almohada en la parte inferior de la espalda si es necesario.  Trate de dormir de lado, de preferencia del lado izquierdo, con una almohada de sostn para embarazadas o 1 o 2 almohadas comunes entre las piernas. ? Si tiene dolor de espalda despus de [name] noche de descanso,  la cama puede ser [employer]. ? Un colchn duro puede brindarle ms apoyo para la Doctor, general practice. Indicaciones generales  No use zapatos con tacones altos.  Siga una dieta saludable. Trate de aumentar de peso dentro de las recomendaciones del mdico.  Use una faja de maternidad, un arns elstico o un cors para la espalda como se lo haya indicado el mdico.  Tome los medicamentos de venta libre y los recetados solamente como se lo haya indicado el mdico.  Animal nutritionist con un fisioterapeuta o un masajista para Licensed conveyancer de Financial controller de espalda. La acupuntura o la terapia de masajes pueden ser tiles.  Concurra a todas las visitas de control como se lo haya indicado el mdico. Esto es importante. Comunquese con un mdico si:  El dolor de Wellsite geologist impide [employer] cotidianas.  Aumenta el dolor en otras partes del cuerpo. Solicite ayuda inmediatamente si:  Siente entumecimiento, hormigueo, debilidad o problemas con el uso de los brazos o las piernas.  Siente un dolor de espalda  intenso que no puede controlar con los medicamentos.  Presenta modificaciones en el control de la vejiga o el intestino.  Siente que le falta el aire, se marea o se desmaya.  Tiene nuseas, vmitos o sudoracin.  Siente un dolor de espalda que es rtmico y de tipo clico, similar a las contracciones del [place]. Las contracciones del parto suelen aparecer cada 1 a 83minutos, duran aproximadamente 6minuto y estn acompaadas de una sensacin de empujar o de presin en la pelvis.  Tiene dolor de espalda y rompe la bolsa de las aguas o tiene sangrado vaginal.  El dolor o el adormecimiento se extienden hacia la pierna.  El dolor aparece despus de una cada.  Siente dolor de un solo lado.  Observa sangre en la orina.  Le aparecen ampollas en la piel en la zona del dolor de espalda. Resumen  Puede deberse a varios factores relacionados con los cambios durante esta etapa.  Siga las indicaciones del mdico para Financial controller, la rigidez y la hinchazn.  Haga ejercicio como se lo haya indicado el mdico. Hacer actividad fsica suave es la mejor forma de evitar o controlar el dolor de espalda.  Tome los medicamentos de venta libre y los recetados solamente como se lo haya indicado el mdico.  Consulting civil engineer a todas las visitas de control como se lo haya indicado el mdico. Esto es importante. Esta informacin no tiene Marine scientist el consejo del mdico. Asegrese de hacerle al mdico cualquier pregunta que tenga. Document Revised: 03/31/2018 Document Reviewed: 03/31/2018 Elsevier Patient Education  [place].

## 2020-03-03 ENCOUNTER — Other Ambulatory Visit: Payer: Self-pay

## 2020-03-03 ENCOUNTER — Ambulatory Visit (INDEPENDENT_AMBULATORY_CARE_PROVIDER_SITE_OTHER): Payer: Self-pay | Admitting: Family Medicine

## 2020-03-03 VITALS — BP 92/58 | HR 83 | Wt 177.1 lb

## 2020-03-03 DIAGNOSIS — Z3493 Encounter for supervision of normal pregnancy, unspecified, third trimester: Secondary | ICD-10-CM

## 2020-03-03 NOTE — Patient Instructions (Signed)
It was great seeing you today!  Please check-out at the front desk before leaving the clinic. I'd like to see you back in 1-2 weeks but if you need to be seen earlier than that for any new issues we're happy to fit you in, just give [place] a call!  Visit Remembers: - Follow up in 1-2 weeks  - To Do: BRING IN [facility], you reported that you had the Tdap vaccine about a month ago.   Go to the MAU for any vaginal bleeding or loss of fluid.    Regarding lab work today:  Due to recent changes in healthcare laws, you may see the results of your imaging and laboratory studies on MyChart before your provider has had a chance to review them.  I understand that in some cases there may be results that are confusing or concerning to you. Not all laboratory results come back in the same time frame and you may be waiting for multiple results in order to interpret others.  Please give [place] 72 hours in order for your provider to thoroughly review all the results before contacting the office for clarification of your results. If everything is normal, you will get a letter in the mail or a message in My Chart. Please give [place] a call if you do not hear from [place] after 2 weeks.  Please bring all of your medications with you to each visit.    If you haven't already, sign up for My Chart to have easy access to your labs results, and communication with your primary care physician.  Feel free to call with any questions or concerns at any time, at [phone].   Take care,  Dr. Katherina [facility]

## 2020-03-03 NOTE — Progress Notes (Signed)
Rachael Jackson is a 34 y.o. G4P3003 at [redacted]w[redacted]d here for routine follow up.  She reports no pelvic pain, vaginal bleeding or loss of fluid.  Endorses fatigue and intermittent contractions lasting less than 2 minutes.    See flow sheet for details.  A/P: Pregnancy at [redacted]w[redacted]d.  Doing well.   Pregnancy issues include none.   Infant feeding choice: breast / possibly both Contraception choice: Nexaplanon  Infant circumcision desired: NA, girl   Tdap was not given today.  Recieved about a month ago at the [facility] department; records updated.    Presentation was mostly nearly transverse today.  Recommend repeat bedside US to assess presentation at follow up visit. If not, cephalic patient will likely need to be scheduled for external cephalic version.   Third semester labs drawn.    Preterm labor and fetal movement precautions reviewed. Safe sleep discussed. Follow up 1-2 weeks.

## 2020-03-04 LAB — CBC
Hematocrit: 33.6 % — ABNORMAL LOW (ref 34.0–46.6)
Hemoglobin: 11.4 g/dL (ref 11.1–15.9)
MCH: 31.4 pg (ref 26.6–33.0)
MCHC: 33.9 g/dL (ref 31.5–35.7)
MCV: 93 fL (ref 79–97)
Platelets: 196 10*3/uL (ref 150–450)
RBC: 3.63 x10E6/uL — ABNORMAL LOW (ref 3.77–5.28)
RDW: 13.9 % (ref 11.7–15.4)
WBC: 7.2 10*3/uL (ref 3.4–10.8)

## 2020-03-04 LAB — RPR: RPR Ser Ql: NONREACTIVE

## 2020-03-04 LAB — HIV ANTIBODY (ROUTINE TESTING W REFLEX): HIV Screen 4th Generation wRfx: NONREACTIVE

## 2020-03-18 ENCOUNTER — Other Ambulatory Visit: Payer: Self-pay

## 2020-03-18 ENCOUNTER — Telehealth: Payer: Self-pay

## 2020-03-18 ENCOUNTER — Ambulatory Visit (INDEPENDENT_AMBULATORY_CARE_PROVIDER_SITE_OTHER): Payer: Self-pay | Admitting: Family Medicine

## 2020-03-18 ENCOUNTER — Other Ambulatory Visit (HOSPITAL_COMMUNITY)
Admission: RE | Admit: 2020-03-18 | Discharge: 2020-03-18 | Disposition: A | Discharge status: Home / Self Care | Payer: Self-pay | Source: Ambulatory Visit | Attending: Family Medicine | Admitting: Family Medicine

## 2020-03-18 VITALS — BP 110/60 | HR 89 | Wt 178.4 lb

## 2020-03-18 DIAGNOSIS — Z3493 Encounter for supervision of normal pregnancy, unspecified, third trimester: Secondary | ICD-10-CM

## 2020-03-18 NOTE — Telephone Encounter (Signed)
Spoke with pt informed her of OB  appt on Mar 25, 2020 at 4:00 with Dr. Rachael Darby. Pt understood. Aquilla Solian, CMA'

## 2020-03-18 NOTE — Progress Notes (Signed)
Rachael Jackson is a 34 y.o. G4P3003 at [redacted]w[redacted]d here for routine follow up.  She reports intermittent contractions that last 2 minutes and go away.  See flow sheet for details.   A/P: Pregnancy at [redacted]w[redacted]d.  Doing well.   Pregnancy issues include None.   Infant feeding choice:  breast and bottle  Contraception choice: Nexplanon  Infant circumcision desired: not applicable, girl   Tdap was not given today. Given at health department on 01/20/20.  GBS and gc/chlamydia testing was performed today.  Preterm labor and fetal movement precautions reviewed. Safe sleep discussed. Follow up 1 week(s).  Discussed with Dr. Leveda Anna, pt should [facility] with [facility].    Katha Cabal, DO PGY-2, [facility] 03/18/2020 3:07 PM

## 2020-03-18 NOTE — Patient Instructions (Addendum)
It was great seeing you today!   I'd like to see you back 1 week  but if you need to be seen earlier than that for any new issues we're happy to fit you in, just give [place] a call!   If you have questions or concerns please do not hesitate to call at [phone].  Dr. [facility]   Tercer trimestre de embarazo Third Trimester of Pregnancy El tercer trimestre comprende desde la semana28 hasta la semana40 (desde el mes7 hasta el mes9). El tercer trimestre es un perodo en el que el beb en gestacin (feto) crece rpidamente. Hacia el final del noveno mes, el feto mide alrededor de 20pulgadas (45cm) de largo y pesa entre 6 y 10 libras (2,700 y 58,500kg). Cambios en el cuerpo durante el tercer trimestre Su organismo continuar atravesando por muchos cambios durante el [place]. Estos cambios varan de [place]. Durante el tercer trimestre:  Seguir aumentando de [place]. Es de esperar que aumente entre 25 y 35libras (11 y 16kg) hacia el final del Psychiatrist.  Podrn aparecer las primeras Albertson's caderas, el abdomen y las [place].  Puede tener necesidad de Geographical information systems officer con ms frecuencia porque el feto baja hacia la pelvis y ejerce presin sobre la vejiga.  Puede desarrollar o continuar teniendo Merchant navy officer. Esto se debe a que el aumento de las hormonas hace que los msculos en el tubo digestivo trabajen ms lentamente.  Puede desarrollar o continuar teniendo estreimiento debido a que el aumento de las hormonas ralentiza la digestin y hace que los msculos que [employer] desechos a travs de los intestinos se relajen.  Puede desarrollar hemorroides. Estas son venas hinchadas (venas varicosas) en el recto que pueden causar picazn o dolor.  Puede desarrollar venas hinchadas y abultadas (venas varicosas) en las piernas.  Puede presentar ms dolor en la pelvis, la espalda o los muslos. Esto se debe al Citigroup de peso y al aumento de las hormonas  que relajan las articulaciones.  Tal vez haya cambios en el cabello. Esto cambios pueden incluir su engrosamiento, crecimiento rpido y [employer] Waste Industries textura. Adems, a algunas mujeres se les cae el cabello durante o despus del embarazo, o tienen el cabello seco o fino. Lo ms probable es que el cabello se le normalice despus del nacimiento del beb.  Sus pechos seguirn creciendo y se pondrn cada vez ms sensibles. Un lquido amarillo Charity fundraiser) puede salir de sus pechos. Esta es la primera leche que usted produce para su beb.  El ombligo puede salir hacia afuera.  Puede observar que se le Eli Lilly and Company, el rostro o los tobillos.  Puede presentar un aumento del hormigueo o entumecimiento en las manos, brazos y piernas. La piel de su vientre tambin puede sentirse entumecida.  Puede sentir que le falta el aire debido a que se expande el tero.  Puede tener ms problemas para dormir. Esto puede deberse al tamao de su vientre, una mayor necesidad de Geographical information systems officer y un aumento en el metabolismo de su cuerpo.  Puede notar que el feto "baja" o lo siente ms bajo, en el abdomen (aligeramiento).  Puede tener un aumento de la secrecin vaginal.  Puede notar que las articulaciones se sienten flojas y puede sentir dolor alrededor del hueso plvico. Qu debe esperar en las visitas prenatales Le harn exmenes prenatales cada 2semanas hasta la semana36. A partir de ese momento le harn los [employer]. Durante una visita prenatal de rutina:  La pesarn para asegurarse de que usted y el beb estn creciendo normalmente.  Le tomarn la presin arterial.  Le medirn el abdomen para controlar el desarrollo del beb.  Se escucharn los latidos cardacos fetales.  Se evaluarn los resultados de los estudios solicitados en visitas anteriores.  Le revisarn el cuello del tero cuando est prxima la fecha de parto para controlar si el cuello uterino se ha afinado o adelgazado  (borrado).  Le harn una prueba de estreptococos del grupo B. Esto sucede AutoNationentre las semanas 35 y 8237. El mdico puede preguntarle lo siguiente:  Cmo le gustara que fuera el [place].  Cmo se siente.  Si siente los movimientos del beb.  Si ha tenido sntomas anormales, como prdida de lquido, [place] Beachsangrado, dolores de cabeza intensos o clicos abdominales.  Si est consumiendo algn producto que contenga tabaco, como cigarrillos, tabaco de Theatre managermascar y Administrator, Civil Servicecigarrillos electrnicos.  Si tiene Colgate-Palmolivealguna pregunta. Otros exmenes o estudios de deteccin que pueden realizarse durante el tercer trimestre incluyen lo siguiente:  Anlisis de sangre para controlar los niveles de hierro (anemia).  Controles fetales para determinar su salud, nivel de [place] and the Grenadinesactividad y Designer, jewellerycrecimiento. Si tiene Jerseyalguna enfermedad o hay problemas durante el embarazo, le harn estudios.  Prueba sin estrs. Esta prueba verifica la salud de su beb y se Coc[place] ([place]) Islandsutiliza para Engineer, manufacturingdetectar signos de problemas, tales como si el beb no est recibiendo suficiente oxgeno. Durante esta prueba, se coloca un cinturn alrededor de su vientre. Al moverse el beb, se controla su frecuencia cardaca. Qu es el falso Kennerdelltrabajo de [place]parto? El falso trabajo de parto es una afeccin en la que se sienten pequeos e irregulares espasmos de los msculos del tero (contracciones) que generalmente desaparecen al hacer reposo, cambiar de posicin o al beber agua. Estas contracciones se llaman contracciones de CSX CorporationBraxton Hicks. Las Fifth Third Bancorpcontracciones pueden durar horas, [address] o incluso semanas, antes de que el verdadero trabajo de parto se inicie. Si las contracciones ocurren a intervalos regulares, se vuelven ms frecuentes, aumentan en intensidad o se vuelven dolorosas, debera ver al mdico.  Cules son los signos del In[place] Millstrabajo de [place]parto?  Clicos abdominales.  Contracciones regulares que comienzan en intervalos de 10 minutos y se vuelven ms fuertes y ms frecuentes con el  tiempo.  Contracciones que comienzan en la parte superior del tero y se extienden hacia abajo, a la zona inferior del abdomen y la espalda.  Aumento de la presin en la pelvis y Armed forces logistics/support/administrative officerdolor latente en la espalda.  Una secrecin de mucosidad acuosa o con sangre que sale de la vagina.  Prdida de lquido amnitico. Esto tambin se conoce como "ruptura de la bolsa de las aguas". Esto puede ser un chorro o un goteo constante y lento de lquido. Informe a su mdico si tiene un color u Freeport-McMoRan Copper & Goldolor extrao. Si tiene alguno de Freescale Semiconductorestos signos, llame a su mdico de inmediato, incluso si es antes de la fecha de [place] Squareparto. Siga estas indicaciones en su casa: Medicamentos  Siga las indicaciones del mdico en relacin con el uso de medicamentos. Durante el embarazo, hay medicamentos que pueden tomarse y otros que no.  Tome vitaminas prenatales que contengan por lo menos 600microgramos (?g) de cido flico.  Si est estreida, tome un laxante suave, si el mdico lo autoriza. Qu debe comer y beber   Meriel FlavorsLleve una dieta equilibrada que incluya gran cantidad de frutas y verduras frescas, cereales integrales, buenas fuentes de protenas como carnes [place]magras, huevos o tofu, y lcteos descremados. El mdico la ayudar a  determinar la cantidad de peso que puede aumentar.  No coma carne cruda ni quesos sin cocinar. Estos elementos contienen grmenes que pueden causar defectos congnitos en el beb.  Si no consume muchos alimentos con calcio, hable con su mdico sobre si debera tomar un suplemento diario de calcio.  La ingesta diaria de cuatro o cinco comidas pequeas en lugar de tres comidas abundantes.  Limite el consumo de alimentos con alto contenido de grasas y azcares procesados, como alimentos fritos o dulces.  Para evitar el estreimiento: ? Bebe suficiente lquido para mantener la orina clara o de color amarillo plido. ? Consuma alimentos ricos en fibra, como frutas y verduras frescas, cereales integrales y  frijoles. Actividad  Haga ejercicio solamente como se lo haya indicado el mdico. La mayora de las mujeres pueden continuar su rutina de ejercicios durante el [place]. Intente realizar como mnimo de actividad fsica por lo menos 5das a la semana. Deje de hacer ejercicio si experimenta contracciones uterinas.  Evite levantar pesos [employer] Brands.  No haga ejercicio en condiciones de calor o humedad extremas, o a grandes alturas.  Use zapatos cmodos de tacn bajo.  Adopte una buena postura.  Puede seguir teniendo [employer] [place]. [place] Travelers, excepto que el mdico le diga lo contrario. Alivio del dolor y del [place] pausas frecuentes y descanse con las piernas elevadas si tiene calambres en las piernas o dolor en la zona lumbar.  Dese baos de asiento con agua tibia para Engineer, materials o las molestias causadas por las hemorroides. Use una crema para las hemorroides si el mdico la autoriza.  Use un sostn que le brinde buen soporte para prevenir las molestias causadas por la sensibilidad en los pechos.  Si tiene venas varicosas: ? Use pantimedias que brinden soporte o medias de compresin como se lo haya indicado el mdico. ? Eleve los pies durante , 3 o 4veces por da. Cuidados prenatales  Escriba sus preguntas. Llvelas cuando concurra a las visitas prenatales.  Concurra a todas las visitas prenatales tal como se lo haya indicado el mdico. Esto es importante. Seguridad  Use el cinturn de seguridad en todo momento mientras conduce.  Haga una lista de los nmeros de telfono de Associate Professor, que [employer]. [employer] nmeros de telfono de familiares, Ferdinand, el hospital y los departamentos de polica y bomberos. Instrucciones generales  Evite el contacto con las bandejas sanitarias de los gatos y la tierra que estos animales usan. Estos elementos contienen grmenes que pueden causar defectos congnitos en el beb. Si tiene Financial controller, pdale a alguien que limpie la caja  de arena por usted.  No haga viajes largos excepto que sea absolutamente necesario y solo con la autorizacin de su mdico.  No se d baos de inmersin en agua caliente, baos turcos ni saunas.  No beber alcohol.  No consuma ningn producto que contenga nicotina o tabaco, como cigarrillos y Administrator, Civil Service. Si necesita ayuda para dejar de fumar, consulte al mdico.  No use hierbas medicinales ni medicamentos que no le hayan recetado. Estas sustancias qumicas afectan la formacin y el desarrollo del beb.  No se haga duchas vaginales ni use tampones o toallas higinicas perfumadas.  No mantenga las piernas cruzadas durante largos periodos de [place].  Para prepararse para la llegada de su beb: ? Tome clases prenatales para entender, Education administrator, y hacer preguntas sobre el [place] de parto y [place]. ? Haga un ensayo de la partida al hospital. ? Visite el hospital y recorra el rea de maternidad. ?  Pida un permiso de maternidad o paternidad a sus empleadores. ? Organice para que Research scientist (physical sciences) o amigo cuide a sus mascotas mientras usted est en el hospital. ? Compre un asiento de seguridad FirstEnergy Corp, y asegrese de saber cmo instalarlo en su automvil. ? Prepare el bolso que llevar al hospital. ? Prepare la habitacin del beb. Asegrese de quitar todas las almohadas y [place] de peluche de la cuna del beb para evitar la asfixia.  Visite a su dentista si no lo ha Occupational hygienist. Use un cepillo de dientes blando para higienizarse los dientes y psese el hilo dental con suavidad. Comunquese con un mdico si:  No est segura de que est en trabajo de parto o de que ha roto la bolsa de las aguas.  Se siente mareada.  Siente clicos leves, presin en la pelvis o dolor persistente en el abdomen.  Siente dolor en la parte inferior de la espalda.  Tiene nuseas, vmitos o diarrea persistentes.  Brett Fairy secrecin vaginal inusual o con mal  olor.  Siente dolor al ConocoPhillips. Solicite ayuda de inmediato si:  Rompe la bolsa de las aguas antes de la semana 37.  Tiene contracciones regulares en intervalos de menos de 5 minutos antes de la semana 37.  Tiene fiebre.  Tiene una prdida de lquido por la vagina.  Tiene sangrado o pequeas prdidas vaginales.  Tiene dolor o clicos abdominales intensos.  Baja de peso o sube de peso rpidamente.  Tiene dificultad para respirar y siente dolor de pecho.  Sbitamente se le hinchan mucho el rostro, las [place], los tobillos, los pies o las piernas.  Su beb se mueve menos de 10 veces en 2 horas.  Siente un dolor de cabeza intenso que no se alivia al tomar United Par[employer].  Nota cambios en la visin. Resumen  El tercer trimestre comprende desde la semana28 The ServiceMaster Company semana40, es decir, desde el mes7 hasta el [address] El tercer trimestre es un perodo en el que el beb en gestacin (feto) crece rpidamente.  Durante el tercer trimestre, su incomodidad puede aumentar a medida que usted y su beb continan aumentando de [place]. Es posible que tenga dolor abdominal, en las piernas y en la [place], [place] para dormir y [place] mayor necesidad de Geographical information systems officer.  Durante el tercer trimestre, sus pechos seguirn creciendo y se pondrn cada vez ms sensibles. Un lquido amarillo Charity fundraiser) puede salir de sus pechos. Esta es la primera leche que usted produce para su beb.  El falso trabajo de parto es una afeccin en la que se sienten pequeos e irregulares espasmos de los msculos del tero (contracciones) que a Corporate investment banker. Estas contracciones se llaman contracciones de CSX Corporation. Las Fifth Third Bancorp pueden durar horas, [address]  [address] o incluso semanas, antes de que el verdadero trabajo de parto se inicie.  Los signos del trabajo de parto pueden incluir: calambres abdominales; contracciones regulares que comienzan en intervalos de 10 minutos y se vuelven ms fuertes y ms frecuentes con el tiempo; una  secrecin de mucosidad acuosa o con sangre que sale de la vagina; aumento de la presin en la pelvis y Engineer, mining latente en la espalda; y prdida de lquido amnitico. Esta informacin no tiene Theme park manager el consejo del mdico. Asegrese de hacerle al mdico cualquier pregunta que tenga. Document Revised: 01/01/2017 Document Reviewed: 01/01/2017 Elsevier Patient Education  2020 [employer].

## 2020-03-21 LAB — CERVICOVAGINAL ANCILLARY ONLY
Chlamydia: NEGATIVE
Comment: NEGATIVE
Comment: NORMAL
Neisseria Gonorrhea: NEGATIVE

## 2020-03-22 LAB — CULTURE, BETA STREP (GROUP B ONLY): Strep Gp B Culture: NEGATIVE

## 2020-03-25 ENCOUNTER — Other Ambulatory Visit: Payer: Self-pay

## 2020-03-25 ENCOUNTER — Ambulatory Visit (INDEPENDENT_AMBULATORY_CARE_PROVIDER_SITE_OTHER): Payer: Self-pay | Admitting: Family Medicine

## 2020-03-25 VITALS — BP 98/70 | HR 77 | Wt 181.4 lb

## 2020-03-25 DIAGNOSIS — Z1159 Encounter for screening for other viral diseases: Secondary | ICD-10-CM

## 2020-03-25 DIAGNOSIS — Z3493 Encounter for supervision of normal pregnancy, unspecified, third trimester: Secondary | ICD-10-CM

## 2020-03-25 NOTE — Progress Notes (Signed)
Rachael Jackson is a 34 y.o. G4P3003 at [redacted]w[redacted]d here for routine follow up.  She reports pelvic pressure.  She had brown discharge on Saturday 04/26/20. See flow sheet for details.  A/P: Pregnancy at [redacted]w[redacted]d. Doing well.   Pregnancy issues include none.  Infant feeding choice: breast and bottle Contraception choice: Nexplanon Infant circumcision desired: not applicable, girl  GBS and gc/chlamydia testing results were reviewed today.  Declines to Hep C as she has no insurance and does not want to have an additional bill.   Anatomy scan and prenatal labs reviewed.  Labor and fetal movement precautions reviewed.  Follow up 1 week.

## 2020-03-25 NOTE — Patient Instructions (Signed)
It was great seeing you today.  There is not much time left until you meet your baby!   I'd like to see you back in 1 week but if you need to be seen earlier than that for any new issues we're happy to fit you in, just give [place] a call!   If you have questions or concerns please do not hesitate to call at [phone].  Dr. [facility]

## 2020-03-31 ENCOUNTER — Other Ambulatory Visit: Payer: Self-pay

## 2020-03-31 ENCOUNTER — Ambulatory Visit (INDEPENDENT_AMBULATORY_CARE_PROVIDER_SITE_OTHER): Payer: Self-pay | Admitting: Family Medicine

## 2020-03-31 VITALS — BP 102/70 | HR 78 | Wt 180.0 lb

## 2020-03-31 DIAGNOSIS — Z3403 Encounter for supervision of normal first pregnancy, third trimester: Secondary | ICD-10-CM

## 2020-03-31 NOTE — Patient Instructions (Signed)
Thank you for coming to see me today. It was a pleasure.   If you have worsening contractions, gush of fluid go to [facility] at [facility]  Please follow-up with PCP in 1 week  If you have any questions or concerns, please do not hesitate to call the office at [phone].  Best,   Dana Allan, MD Family Medicine Residency    Third Trimester of Pregnancy  The third trimester is from week 28 through week 40 (months 7 through 9). This trimester is when your unborn baby (fetus) is growing very fast. At the end of the ninth month, the unborn baby is about 20 inches in length. It weighs about 6-10 pounds. Follow these instructions at home: Medicines  Take over-the-counter and prescription medicines only as told by your doctor. Some medicines are safe and some medicines are not safe during pregnancy.  Take a prenatal vitamin that contains at least 600 micrograms (mcg) of folic acid.  If you have trouble pooping (constipation), take medicine that will make your stool soft (stool softener) if your doctor approves. Eating and drinking   Eat regular, healthy meals.  Avoid raw meat and uncooked cheese.  If you get low calcium from the food you eat, talk to your doctor about taking a daily calcium supplement.  Eat four or five small meals rather than three large meals a day.  Avoid foods that are high in fat and sugars, such as fried and sweet foods.  To prevent constipation: ? Eat foods that are high in fiber, like fresh fruits and vegetables, whole grains, and beans. ? Drink enough fluids to keep your pee (urine) clear or pale yellow. Activity  Exercise only as told by your doctor. Stop exercising if you start to have cramps.  Avoid heavy lifting, wear low heels, and sit up straight.  Do not exercise if it is too hot, too humid, or if you are in a place of great height (high altitude).  You may continue to have sex unless your doctor tells you not to. Relieving  pain and discomfort  Wear a good support bra if your breasts are tender.  Take frequent breaks and rest with your legs raised if you have leg cramps or low back pain.  Take warm water baths (sitz baths) to soothe pain or discomfort caused by hemorrhoids. Use hemorrhoid cream if your doctor approves.  If you develop puffy, bulging veins (varicose veins) in your legs: ? Wear support hose or compression stockings as told by your doctor. ? Raise (elevate) your feet for 15 minutes, 3-4 times a day. ? Limit salt in your food. Safety  Wear your seat belt when driving.  Make a list of emergency phone numbers, including numbers for family, friends, the hospital, and police and fire departments. Preparing for your baby's arrival To prepare for the arrival of your baby:  Take prenatal classes.  Practice driving to the hospital.  Visit the hospital and tour the maternity area.  Talk to your work about taking leave once the baby comes.  Pack your hospital bag.  Prepare the baby's room.  Go to your doctor visits.  Buy a rear-facing car seat. Learn how to install it in your car. General instructions  Do not use hot tubs, steam rooms, or saunas.  Do not use any products that contain nicotine or tobacco, such as cigarettes and e-cigarettes. If you need help quitting, ask your doctor.  Do not drink alcohol.  Do not douche  or use tampons or scented sanitary pads.  Do not cross your legs for long periods of time.  Do not travel for long distances unless you must. Only do so if your doctor says it is okay.  Visit your dentist if you have not gone during your pregnancy. Use a soft toothbrush to brush your teeth. Be gentle when you floss.  Avoid cat litter boxes and soil used by cats. These carry germs that can cause birth defects in the baby and can cause a loss of your baby (miscarriage) or stillbirth.  Keep all your prenatal visits as told by your doctor. This is important. Contact  a doctor if:  You are not sure if you are in labor or if your water has broken.  You are dizzy.  You have mild cramps or pressure in your lower belly.  You have a nagging pain in your belly area.  You continue to feel sick to your stomach, you throw up, or you have watery poop.  You have bad smelling fluid coming from your vagina.  You have pain when you pee. Get help right away if:  You have a fever.  You are leaking fluid from your vagina.  You are spotting or bleeding from your vagina.  You have severe belly cramps or pain.  You lose or gain weight quickly.  You have trouble catching your breath and have chest pain.  You notice sudden or extreme puffiness (swelling) of your face, hands, ankles, feet, or legs.  You have not felt the baby move in over an hour.  You have severe headaches that do not go away with medicine.  You have trouble seeing.  You are leaking, or you are having a gush of fluid, from your vagina before you are 37 weeks.  You have regular belly spasms (contractions) before you are 37 weeks. Summary  The third trimester is from week 28 through week 40 (months 7 through 9). This time is when your unborn baby is growing very fast.  Follow your doctor's advice about medicine, food, and activity.  Get ready for the arrival of your baby by taking prenatal classes, getting all the baby items ready, preparing the baby's room, and visiting your doctor to be checked.  Get help right away if you are bleeding from your vagina, or you have chest pain and trouble catching your breath, or if you have not felt your baby move in over an hour. This information is not intended to replace advice given to you by your health care provider. Make sure you discuss any questions you have with your health care provider. Document Revised: 12/11/2018 Document Reviewed: 09/25/2016 Elsevier Patient Education  2020 [employer]vin[employer].

## 2020-03-31 NOTE — Progress Notes (Signed)
Rachael Jackson is a 34 y.o. G4P3003 at [redacted]w[redacted]d here for routine follow up. She is dated by early ultrasound.  She reports backache and occasional contractions. See flow sheet for details.  A/P: Pregnancy at [redacted]w[redacted]d.  Doing well.   . Dating reviewed, dating tab is correct . Fetal heart tones Appropriate . Fundal height within expected range.  . Fetal position confirmed Vertex using Ultrasound .  Marland Kitchen Pregnancy issues include None . Problem list updated: Yes  . Infant feeding choice: Breastfeeding . Contraception choice: Nexplanon  . Infant circumcision desired not applicable  . Influenza vaccine not administered as not influenza season.   . Tdap previously administered between 27-36 weeks   . GBS and gc/chlamydia testing results were reviewed today.   . Pregnancy education regarding labor, fetal movement,  benefits of breastfeeding, contraception, and safe infant sleep were discussed.  . Labor and fetal movement precautions reviewed.   Follow up 1 week.   Dana Allan, MD Family Medicine Residency

## 2020-04-05 ENCOUNTER — Ambulatory Visit (INDEPENDENT_AMBULATORY_CARE_PROVIDER_SITE_OTHER): Payer: Self-pay | Admitting: Family Medicine

## 2020-04-05 ENCOUNTER — Other Ambulatory Visit: Payer: Self-pay

## 2020-04-05 VITALS — BP 100/60 | HR 85 | Wt 181.0 lb

## 2020-04-05 DIAGNOSIS — O09523 Supervision of elderly multigravida, third trimester: Secondary | ICD-10-CM

## 2020-04-05 NOTE — Patient Instructions (Addendum)
You have been scheduled for an induction on 04/16/20.  They will call you on the morning of induction to tell you what time to come in.  Next week, you will be over 40 weeks.   We have scheduled you for a biophysical profile to check in on baby on 04/11/20 at 9:30 AM at the health department. If you go into labor before this, you do not have to go to any of these appointments.   Please schedule a follow-up with [place] in 1 week.  Hopefully, you will go into spontaneous labor before then.

## 2020-04-05 NOTE — Progress Notes (Signed)
  [facility] Prenatal Visit  Rachael Jackson is a 34 y.o. [phone] at [redacted]w[redacted]d here for routine follow up. She is dated by 17 wk ultrasound.  She reports backache, no bleeding, no leaking and regular contractions (1 minute per hour). She reports fetal movement. She denies vaginal bleeding, contractions, or loss of fluid.  See flow sheet for details.  Vitals:   04/05/20 1115  BP: 100/60  Pulse: 85     A/P: Pregnancy at [redacted]w[redacted]d.  Doing well.   1. Routine prenatal care:  Marland Kitchen Dating reviewed, dating tab is correct . Fetal heart tones Appropriate . Fundal height within expected range.  . Fetal position confirmed Vertex.  . Infant feeding choice: Breastfeeding . Contraception choice: Nexplanon  . Infant circumcision desired not applicable . Pain control in labor discussed and patient desires undecided .  Marland Kitchen Influenza vaccine not administered as not influenza season.   . Tdap previously administered between 27-36 weeks  . Biophysical profile is scheduled between 40w0 day and [redacted]w[redacted]d. Induction of labor discussed with patient. Discussed plan with preceptor and Ob on call. Induction scheduled for 41 weeks on 04/16/20.  . Labor and fetal movement precautions reviewed.  2. Pregnancy issues include the following and were addressed as appropriate today:  . Planning for post-dates pregnancy. Induction scheduled: 04/16/20. BPP for Monday, 04/11/20 . Problem list and pregnancy box updated: Yes.     Scheduled for 1 week follow up visit.

## 2020-04-06 ENCOUNTER — Encounter: Payer: Self-pay | Admitting: Family Medicine

## 2020-04-06 ENCOUNTER — Inpatient Hospital Stay (HOSPITAL_COMMUNITY)
Admission: AD | Admit: 2020-04-06 | Discharge: 2020-04-06 | Disposition: A | Discharge status: Home / Self Care | Payer: Medicaid Other | Attending: Obstetrics & Gynecology | Admitting: Obstetrics & Gynecology

## 2020-04-06 ENCOUNTER — Encounter (HOSPITAL_COMMUNITY): Payer: Self-pay | Admitting: Obstetrics & Gynecology

## 2020-04-06 ENCOUNTER — Other Ambulatory Visit: Payer: Self-pay

## 2020-04-06 DIAGNOSIS — O471 False labor at or after 37 completed weeks of gestation: Secondary | ICD-10-CM | POA: Diagnosis not present

## 2020-04-06 DIAGNOSIS — R252 Cramp and spasm: Secondary | ICD-10-CM | POA: Insufficient documentation

## 2020-04-06 DIAGNOSIS — O09523 Supervision of elderly multigravida, third trimester: Secondary | ICD-10-CM | POA: Insufficient documentation

## 2020-04-06 DIAGNOSIS — Z3A39 39 weeks gestation of pregnancy: Secondary | ICD-10-CM | POA: Insufficient documentation

## 2020-04-06 DIAGNOSIS — O26893 Other specified pregnancy related conditions, third trimester: Secondary | ICD-10-CM | POA: Insufficient documentation

## 2020-04-06 DIAGNOSIS — R309 Painful micturition, unspecified: Secondary | ICD-10-CM | POA: Insufficient documentation

## 2020-04-06 DIAGNOSIS — M545 Low back pain: Secondary | ICD-10-CM | POA: Insufficient documentation

## 2020-04-06 LAB — URINALYSIS, ROUTINE W REFLEX MICROSCOPIC
Bilirubin Urine: NEGATIVE
Glucose, UA: NEGATIVE mg/dL
Ketones, ur: NEGATIVE mg/dL
Nitrite: NEGATIVE
Protein, ur: NEGATIVE mg/dL
Specific Gravity, Urine: 1.017 (ref 1.005–1.030)
WBC, UA: >50 WBC/hpf — ABNORMAL HIGH (ref 0–5)
pH: 6 (ref 5.0–8.0)

## 2020-04-06 NOTE — MAU Note (Signed)
Pt reporting lower back pain and cramps today. She was 5 cm at the doctor yesterday. Also having burning with urination.  Denies LOF or VB. +FM

## 2020-04-06 NOTE — Discharge Instructions (Signed)

## 2020-04-06 NOTE — MAU Provider Note (Signed)
First Provider Initiated Contact with Patient 04/06/20 2037       S: [name] is a 34 y.o. [phone] at [redacted]w[redacted]d  who presents to MAU today complaining contractions q 20 minutes since earlier today. She denies vaginal bleeding. She denies LOF. She reports normal fetal movement.  Patient was checked in the office yesterday and was 5cm   O: BP (!) 118/59 (BP Location: Right Arm)   Pulse 79   Temp (!) 97.3 F (36.3 C) (Oral)   Resp 18   Ht 5\' 4"  (1.626 m)   Wt 82.8 kg   LMP 06/25/2019   SpO2 99% Comment: room ai r  BMI 31.33 kg/m  GENERAL: Well-developed, well-nourished female in no acute distress.  HEAD: Normocephalic, atraumatic.  CHEST: Normal effort of breathing, regular heart rate ABDOMEN: Soft, nontender, gravid  Cervical exam:  4.5/50/-2  Fetal Monitoring: Baseline: 135 Variability: moderate Accelerations: 15x15 Decelerations: none Contractions: irregular   Results for orders placed or performed during the hospital encounter of 04/06/20 (from the past 24 hour(s))  Urinalysis, Routine w reflex microscopic Urine, Clean Catch     Status: Abnormal   Collection Time: 04/06/20  6:46 PM  Result Value Ref Range   Color, Urine YELLOW YELLOW   APPearance CLOUDY (A) CLEAR   Specific Gravity, Urine 1.017 1.005 - 1.030   pH 6.0 5.0 - 8.0   Glucose, UA NEGATIVE NEGATIVE mg/dL   Hgb urine dipstick MODERATE (A) NEGATIVE   Bilirubin Urine NEGATIVE NEGATIVE   Ketones, ur NEGATIVE NEGATIVE mg/dL   Protein, ur NEGATIVE NEGATIVE mg/dL   Nitrite NEGATIVE NEGATIVE   Leukocytes,Ua LARGE (A) NEGATIVE   RBC / HPF 11-20 0 - 5 RBC/hpf   WBC, UA >50 (H) 0 - 5 WBC/hpf   Bacteria, UA MANY (A) NONE SEEN   Squamous Epithelial / LPF 21-50 0 - 5   Mucus PRESENT    Ca Oxalate Crys, UA PRESENT       A: 1. AMA (advanced maternal age) multigravida 35+, third trimester   2. False labor after 37 completed weeks of gestation   3. [redacted] weeks gestation of pregnancy     P: Urine  culture pending  Labor precautions  DW patient that no change in cervix and no regular contraction pattern. Ok to await active labor at home.   06/06/20 DNP, CNM  04/06/20  8:47 PM

## 2020-04-07 LAB — URINE CULTURE: Culture: <10000 — AB

## 2020-04-10 ENCOUNTER — Inpatient Hospital Stay (HOSPITAL_COMMUNITY)
Admission: AD | Admit: 2020-04-10 | Discharge: 2020-04-12 | DRG: 807 | Disposition: A | Discharge status: Home / Self Care | Payer: Medicaid Other | Attending: Obstetrics & Gynecology | Admitting: Obstetrics & Gynecology

## 2020-04-10 ENCOUNTER — Encounter (HOSPITAL_COMMUNITY): Payer: Self-pay | Admitting: Obstetrics & Gynecology

## 2020-04-10 ENCOUNTER — Other Ambulatory Visit: Payer: Self-pay

## 2020-04-10 DIAGNOSIS — Z20822 Contact with and (suspected) exposure to covid-19: Secondary | ICD-10-CM | POA: Diagnosis present

## 2020-04-10 DIAGNOSIS — Z3A40 40 weeks gestation of pregnancy: Secondary | ICD-10-CM

## 2020-04-10 DIAGNOSIS — O4593 Premature separation of placenta, unspecified, third trimester: Secondary | ICD-10-CM | POA: Diagnosis present

## 2020-04-10 DIAGNOSIS — O09523 Supervision of elderly multigravida, third trimester: Secondary | ICD-10-CM

## 2020-04-10 DIAGNOSIS — O26893 Other specified pregnancy related conditions, third trimester: Secondary | ICD-10-CM | POA: Diagnosis present

## 2020-04-10 LAB — CBC
HCT: 34.2 % — ABNORMAL LOW (ref 36.0–46.0)
Hemoglobin: 11.2 g/dL — ABNORMAL LOW (ref 12.0–15.0)
MCH: 31.3 pg (ref 26.0–34.0)
MCHC: 32.7 g/dL (ref 30.0–36.0)
MCV: 95.5 fL (ref 80.0–100.0)
Platelets: 206 10*3/uL (ref 150–400)
RBC: 3.58 MIL/uL — ABNORMAL LOW (ref 3.87–5.11)
RDW: 14.3 % (ref 11.5–15.5)
WBC: 7.5 10*3/uL (ref 4.0–10.5)
nRBC: 0 % (ref 0.0–0.2)

## 2020-04-10 LAB — TYPE AND SCREEN
ABO/RH(D): O POS
Antibody Screen: NEGATIVE

## 2020-04-10 LAB — RPR: RPR Ser Ql: NONREACTIVE

## 2020-04-10 LAB — SARS CORONAVIRUS 2 BY RT PCR (HOSPITAL ORDER, PERFORMED IN ~~LOC~~ HOSPITAL LAB): SARS Coronavirus 2: NEGATIVE

## 2020-04-10 MED ORDER — DIPHENHYDRAMINE HCL 25 MG PO CAPS: 25.0000 mg | ORAL_CAPSULE | Freq: Four times a day (QID) | ORAL | Status: DC | PRN

## 2020-04-10 MED ORDER — BENZOCAINE-MENTHOL 20-0.5 % EX AERO: 1.0000 "application " | INHALATION_SPRAY | CUTANEOUS | Status: DC | PRN

## 2020-04-10 MED ORDER — SENNOSIDES-DOCUSATE SODIUM 8.6-50 MG PO TABS
2.0000 | ORAL_TABLET | ORAL | Status: DC
  Administered 2020-04-11 (×2): 2 via ORAL
  Filled 2020-04-10 (×2): qty 2

## 2020-04-10 MED ORDER — ONDANSETRON HCL 4 MG PO TABS: 4.0000 mg | ORAL_TABLET | ORAL | Status: DC | PRN

## 2020-04-10 MED ORDER — OXYTOCIN-SODIUM CHLORIDE 30-0.9 UT/500ML-% IV SOLN
INTRAVENOUS | Status: AC
  Administered 2020-04-10: 333 mL via INTRAVENOUS
  Filled 2020-04-10: qty 500

## 2020-04-10 MED ORDER — SOD CITRATE-CITRIC ACID 500-334 MG/5ML PO SOLN: 30.0000 mL | ORAL | Status: DC | PRN

## 2020-04-10 MED ORDER — COCONUT OIL OIL
1.0000 "application " | TOPICAL_OIL | Status: DC | PRN
  Administered 2020-04-10: 1 via TOPICAL

## 2020-04-10 MED ORDER — OXYCODONE-ACETAMINOPHEN 5-325 MG PO TABS: 2.0000 | ORAL_TABLET | Freq: Once | ORAL | Status: DC

## 2020-04-10 MED ORDER — IBUPROFEN 600 MG PO TABS
600.0000 mg | ORAL_TABLET | Freq: Four times a day (QID) | ORAL | Status: DC
  Administered 2020-04-10 – 2020-04-11 (×3): 600 mg via ORAL
  Filled 2020-04-10 (×4): qty 1

## 2020-04-10 MED ORDER — LACTATED RINGERS IV SOLN: INTRAVENOUS | Status: DC

## 2020-04-10 MED ORDER — ACETAMINOPHEN 325 MG PO TABS: 650.0000 mg | ORAL_TABLET | ORAL | Status: DC | PRN

## 2020-04-10 MED ORDER — WITCH HAZEL-GLYCERIN EX PADS: 1.0000 "application " | MEDICATED_PAD | CUTANEOUS | Status: DC | PRN

## 2020-04-10 MED ORDER — LACTATED RINGERS IV SOLN
500.0000 mL | INTRAVENOUS | Status: DC | PRN
  Administered 2020-04-10: 500 mL via INTRAVENOUS

## 2020-04-10 MED ORDER — ZOLPIDEM TARTRATE 5 MG PO TABS: 5.0000 mg | ORAL_TABLET | Freq: Every evening | ORAL | Status: DC | PRN

## 2020-04-10 MED ORDER — ONDANSETRON HCL 4 MG/2ML IJ SOLN: 4.0000 mg | INTRAMUSCULAR | Status: DC | PRN

## 2020-04-10 MED ORDER — DIBUCAINE (PERIANAL) 1 % EX OINT: 1.0000 "application " | TOPICAL_OINTMENT | CUTANEOUS | Status: DC | PRN

## 2020-04-10 MED ORDER — LIDOCAINE HCL (PF) 1 % IJ SOLN
30.0000 mL | INTRAMUSCULAR | Status: DC | PRN
  Filled 2020-04-10: qty 30

## 2020-04-10 MED ORDER — ACETAMINOPHEN 325 MG PO TABS
650.0000 mg | ORAL_TABLET | ORAL | Status: DC | PRN
  Administered 2020-04-10 – 2020-04-12 (×6): 650 mg via ORAL
  Filled 2020-04-10 (×6): qty 2

## 2020-04-10 MED ORDER — FENTANYL CITRATE (PF) 100 MCG/2ML IJ SOLN
50.0000 ug | INTRAMUSCULAR | Status: DC | PRN
  Administered 2020-04-10: 100 ug via INTRAVENOUS
  Filled 2020-04-10: qty 2

## 2020-04-10 MED ORDER — OXYCODONE-ACETAMINOPHEN 5-325 MG PO TABS: 1.0000 | ORAL_TABLET | ORAL | Status: DC | PRN

## 2020-04-10 MED ORDER — MISOPROSTOL 200 MCG PO TABS: 600.0000 ug | ORAL_TABLET | Freq: Once | ORAL | Status: AC

## 2020-04-10 MED ORDER — OXYTOCIN BOLUS FROM INFUSION: 333.0000 mL | Freq: Once | INTRAVENOUS | Status: AC

## 2020-04-10 MED ORDER — PRENATAL MULTIVITAMIN CH
1.0000 | ORAL_TABLET | Freq: Every day | ORAL | Status: DC
  Administered 2020-04-10 – 2020-04-11 (×2): 1 via ORAL
  Filled 2020-04-10 (×2): qty 1

## 2020-04-10 MED ORDER — OXYCODONE-ACETAMINOPHEN 5-325 MG PO TABS: 2.0000 | ORAL_TABLET | ORAL | Status: DC | PRN

## 2020-04-10 MED ORDER — SIMETHICONE 80 MG PO CHEW: 80.0000 mg | CHEWABLE_TABLET | ORAL | Status: DC | PRN

## 2020-04-10 MED ORDER — OXYTOCIN-SODIUM CHLORIDE 30-0.9 UT/500ML-% IV SOLN: 2.5000 [IU]/h | INTRAVENOUS | Status: DC

## 2020-04-10 MED ORDER — ONDANSETRON HCL 4 MG/2ML IJ SOLN: 4.0000 mg | Freq: Four times a day (QID) | INTRAMUSCULAR | Status: DC | PRN

## 2020-04-10 MED ORDER — MISOPROSTOL 200 MCG PO TABS
ORAL_TABLET | ORAL | Status: AC
  Administered 2020-04-10: 600 ug via BUCCAL
  Filled 2020-04-10: qty 3

## 2020-04-10 MED ORDER — TETANUS-DIPHTH-ACELL PERTUSSIS 5-2.5-18.5 LF-MCG/0.5 IM SUSP: 0.5000 mL | Freq: Once | INTRAMUSCULAR | Status: DC

## 2020-04-10 NOTE — MAU Note (Signed)
..  Rachael Jackson is a 34 y.o. at [redacted]w[redacted]d here in MAU reporting: CTX for an hour every 5 minutes. Denies vaginal bleeding or LOF. +FM  Pain score: 8/10 Vitals:   04/10/20 0158  BP: 123/64  Pulse: 73  Resp: 17  Temp: 98.3 F (36.8 C)  SpO2: 100%     FHT: Monitors applied assesing Lab orders placed from triage: MAU labor eval. Standing orders

## 2020-04-10 NOTE — Discharge Summary (Addendum)
Postpartum Discharge Summary      Patient Name: Rachael Jackson DOB: [birth year] MRN: [id number]  Date of admission: 04/10/2020 Delivery date:04/10/2020  Delivering provider: Gavin Pound  Date of discharge: 04/12/2020  Admitting diagnosis: Indication for care or intervention in labor or delivery [O75.9] Intrauterine pregnancy: [redacted]w[redacted]d    Secondary diagnosis:  Active Problems:   Indication for care or intervention in labor or delivery   Normal delivery at term  Additional problems: None    Discharge diagnosis: Term Pregnancy Delivered                                              Post partum procedures: None Augmentation: AROM Complications: None  Hospital course: Onset of Labor With Vaginal Delivery      34y.o. yo [id number] admitted in Active Labor on 04/10/2020. Patient arrived to the hospital at 6.5 cm and upon provider arrival in room was 8cm.  Patient requested AROM and delivered shortly after without pharmacological management of pain. Patient had an uncomplicated labor course as follows:  Membrane Rupture Time/Date: 4:31 AM ,04/10/2020   Delivery Method:Vaginal, Spontaneous  Episiotomy: None  Lacerations:  None  Patient had an uncomplicated postpartum course.  She is ambulating, tolerating a regular diet, passing flatus, and urinating well. Patient is discharged home in stable condition on 04/12/20.  Newborn Data: Birth date:04/10/2020  Birth time:4:52 AM  Gender:Female  Living status:Living  Apgars:9 ,9  Weight:8 lb 9 oz (3.884 kg)   Magnesium Sulfate received: No BMZ received: No Rhophylac:N/A MMR:N/A T-DaP:Given prenatally Flu: N/A Transfusion:No  Physical exam  Vitals:   04/11/20 0628 04/11/20 1448 04/11/20 2209 04/12/20 0612  BP: 106/67 106/60 (!) 102/51 (!) 102/59  Pulse: 67 70 77 70  Resp: 18 17  16   Temp: 98 F (36.7 C) 98.3 F (36.8 C) 98 F (36.7 C) 98.2 F (36.8 C)  TempSrc: Oral Oral Oral Oral  SpO2: 99%   100%   General:  alert, cooperative and no distress Lochia: appropriate Uterine Fundus: firm Incision: N/A DVT Evaluation: No evidence of DVT seen on physical exam. Negative Homan's sign. No cords or calf tenderness. No significant calf/ankle edema. Labs: Lab Results  Component Value Date   WBC 6.9 04/11/2020   HGB 9.6 (L) 04/11/2020   HCT 30.1 (L) 04/11/2020   MCV 96.2 04/11/2020   PLT 172 04/11/2020   CMP Latest Ref Rng & Units 09/24/2018  Glucose 65 - 99 mg/dL 74  BUN 6 - 20 mg/dL 15  Creatinine 0.57 - 1.00 mg/dL 0.49(L)  Sodium 134 - 144 mmol/L 142  Potassium 3.5 - 5.2 mmol/L 4.4  Chloride 96 - 106 mmol/L 103  CO2 20 - 29 mmol/L 22  Calcium 8.7 - 10.2 mg/dL 9.4  Total Protein 6.0 - 8.3 g/dL -  Total Bilirubin 0.3 - 1.2 mg/dL -  Alkaline Phos 39 - 117 U/L -  AST 0 - 37 U/L -  ALT 0 - 35 U/L -   Edinburgh Score: Edinburgh Postnatal Depression Scale Screening Tool 04/10/2020  I have been able to laugh and see the funny side of things. 0  I have looked forward with enjoyment to things. 0  I have blamed myself unnecessarily when things went wrong. 0  I have been anxious or worried for no good reason. 0  I have felt scared or  panicky for no good reason. 0  Things have been getting on top of me. 0  I have been so unhappy that I have had difficulty sleeping. 0  I have felt sad or miserable. 0  I have been so unhappy that I have been crying. 0  The thought of harming myself has occurred to me. 0  Edinburgh Postnatal Depression Scale Total 0     After visit meds:  Allergies as of 04/12/2020   No Known Allergies      Medication List     STOP taking these medications    acetaminophen 650 MG CR tablet Commonly known as: TYLENOL Replaced by: acetaminophen 325 MG tablet       TAKE these medications    acetaminophen 325 MG tablet Commonly known as: Tylenol Take 2 tablets (650 mg total) by mouth every 4 (four) hours as needed (for pain scale < 4). Replaces: acetaminophen 650 MG  CR tablet   cetirizine 10 MG tablet Commonly known as: ZYRTEC Take 1 tablet (10 mg total) by mouth daily.   ibuprofen 600 MG tablet Commonly known as: ADVIL Take 1 tablet (600 mg total) by mouth every 6 (six) hours.   Iron (Ferrous Sulfate) 325 (65 Fe) MG Tabs Take 1 tablet by mouth daily.   multivitamin-prenatal 27-0.8 MG Tabs tablet Take 1 tablet by mouth daily at 12 noon.         Discharge home in stable condition Infant Feeding: Bottle and Breast Infant Disposition:home with mother Discharge instruction: per After Visit Summary and Postpartum booklet. Activity: Advance as tolerated. Pelvic rest for 6 weeks.  Diet: routine diet Future Appointments: Future Appointments  Date Time Provider [place]  04/13/2020 11:00 AM Lyndee Hensen, DO [facility] [place]   Follow up Visit:  With [facility] message Sent Patient was not discharged on 8/9 due to concerns for jaundice in the infant. Seen today with no changes, discharging today.  04/12/2020 Rise Patience, DO  PGY 1 Family Medicine  Attestation of Supervision of Student:  I confirm that I have verified the information documented in the medical student's note and that I have also personally reperformed the history, physical exam and all medical decision making activities.  I have verified that all services and findings are accurately documented in this student's note; and I agree with management and plan as outlined in the documentation. I have also made any necessary editorial changes.  Gabriel Carina, [place] for [employer] 04/12/2020 2:49 PM

## 2020-04-10 NOTE — H&P (Signed)
Rachael Jackson is a 34 y.o. female, G4P3003 at 40.1 weeks, presenting for SOL. Patient receives care at [facility] and was supervised for a low risk pregnancy. Pregnancy and medical history significant for problems as listed below. She is GBS negative and expresses a desire for natural management of pain.  She is anticipating a female infant and requests Nexplanon for PP birth control method.     Patient Active Problem List   Diagnosis Date Noted  . Indication for care or intervention in labor or delivery 04/10/2020  . AMA (advanced maternal age) multigravida 35+, third trimester 01/20/2020  . Supervision of low-risk pregnancy, third trimester 09/06/2019  . Seasonal allergic rhinitis due to pollen 01/01/2017    History of present pregnancy:  Last evaluation:  04/05/2020 in office by Holland Commons, MD and C.Manson Passey MD  Nursing Staff Provider  Office Location  [facility] Dating   17 week ultrasound, LMP uncertain (see detailed telephone note)   Language   English Anatomy US  Normal, female   Flu Vaccine    Genetic Screen  NIPS:   AFP:   First Screen:  Quad:   declined  TDaP vaccine   01/20/20 Hgb A1C or  GTT Early  Third trimester   Rhogam  NA   LAB RESULTS   Feeding Plan Breast  Blood Type   O positive   Contraception Nexplanon  Antibody  Neg  Circumcision NA, girl Rubella  Immune   Pediatrician  [facility]  RPR   Non reactive   Support Person Husband - [place] HBsAg   Non reactive   Prenatal Classes  HIV  Non-reactive   BTL Consent NA GBS Negative  VBAC Consent NA Pap  await results from health dept    Hgb Electro  Normal adult     CF     SMA     Waterbirth  [ ]  Class [ ]  Consent [ ]  CNM visit     OB History    Gravida  4   Para  3   Term  3   Preterm  0   AB  0   Living  3     SAB  0   TAB  0   Ectopic  0   Multiple  0   Live Births  1             Past Medical History:  Diagnosis Date  . GERD (gastroesophageal reflux disease)   . Medical history  non-contributory    Past Surgical History:  Procedure Laterality Date  . NO PAST SURGERIES     Family History: family history is not on file. Social History:  reports that she has never smoked. She has never used smokeless tobacco. She reports that she does not drink alcohol and does not use drugs.   Prenatal Transfer Tool  Maternal Diabetes: No Genetic Screening: Declined Maternal Ultrasounds/Referrals: Normal Fetal Ultrasounds or other Referrals:  None Maternal Substance Abuse:  No Significant Maternal Medications:  None Significant Maternal Lab Results: Group B Strep negative   Maternal Assessment:  ROS: +Contractions, -LOF, -Vaginal Bleeding, +Fetal Movement  All other systems reviewed and negative.    No Known Allergies   Dilation: 8 Effacement (%): 90 Station: -1 Exam by:: K Vernon RNC Blood pressure 128/67, pulse 73, temperature 98.4 F (36.9 C), temperature source Oral, resp. rate 18, last menstrual period 06/25/2019, SpO2 100 %, unknown if currently breastfeeding.  Physical Exam Constitutional:      Appearance:  Normal appearance.  HENT:     Head: Normocephalic and atraumatic.  Eyes:     Conjunctiva/sclera: Conjunctivae normal.  Cardiovascular:     Rate and Rhythm: Normal rate and regular rhythm.     Pulses: Normal pulses.     Heart sounds: Normal heart sounds.  Pulmonary:     Effort: Pulmonary effort is normal. No respiratory distress.     Breath sounds: Normal breath sounds.  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  AROM of moderate amt clear fluid.  Musculoskeletal:     Cervical back: Normal range of motion.  Skin:    General: Skin is warm and dry.  Neurological:     Mental Status: She is alert and oriented to person, place, and time.  Psychiatric:        Mood and Affect: Mood normal.        Behavior: Behavior normal.         Thought Content: Thought content normal.        Judgment: Judgment normal.     Fetal Assessment: Leopolds: -Pelvis: Proven to 8lbs -EFW: 7 1/2-3/4 lbs -Presentation: Vertex  FHR: 120 bpm, Mod Var, -Decels, +Accels UCs:  Q13min, palpates moderate    Assessment IUP at 40.1 weeks Cat I FT Active Labor GBS Negative Amniotomy  Plan: Admit to [employer]  Routine Labor and Delivery Orders per Protocol In room to complete assessment and discuss POC: Patient declines pain medication and is coping well.  Patient requests AROM and provider agreeable.  Discussed AROM r/b including increased risk of infection, cord prolapse, fetal intolerance, and decreased labor time. No questions or concerns and patient desires to proceed with AROM.  Anticipate SVD  Joellyn Quails, MSN 04/10/2020, 4:26 AM

## 2020-04-10 NOTE — Plan of Care (Signed)

## 2020-04-11 LAB — CBC
HCT: 30.1 % — ABNORMAL LOW (ref 36.0–46.0)
Hemoglobin: 9.6 g/dL — ABNORMAL LOW (ref 12.0–15.0)
MCH: 30.7 pg (ref 26.0–34.0)
MCHC: 31.9 g/dL (ref 30.0–36.0)
MCV: 96.2 fL (ref 80.0–100.0)
Platelets: 172 10*3/uL (ref 150–400)
RBC: 3.13 MIL/uL — ABNORMAL LOW (ref 3.87–5.11)
RDW: 14.5 % (ref 11.5–15.5)
WBC: 6.9 10*3/uL (ref 4.0–10.5)
nRBC: 0 % (ref 0.0–0.2)

## 2020-04-11 MED ORDER — ACETAMINOPHEN 325 MG PO TABS: 650.0000 mg | ORAL_TABLET | ORAL | 0 refills | Status: AC | PRN

## 2020-04-11 MED ORDER — IBUPROFEN 600 MG PO TABS: 600.0000 mg | ORAL_TABLET | Freq: Four times a day (QID) | ORAL | 0 refills | Status: AC

## 2020-04-11 MED FILL — IBUPROFEN 600 MG TABLET: 600 | 8 days supply | Qty: 30 | Fill #0

## 2020-04-11 NOTE — Lactation Note (Addendum)
This note was copied from a baby's chart. Lactation Consultation Note  Patient Name: Rachael Jackson [id number] Date: 04/11/2020 Reason for consult: Follow-up assessment   Offered Spanish interpreter and mother declined.  P4, Baby 29 hours old.  Mother breastfed other children for 2-2.5 years. Baby latched upon entering with #24NS that mother requested due to tender nipples. Repositioned baby in football hold and baby sustained latch for more than 10 min.   No cracks or abrasions.  Colostrum in NS when baby unlatched. Mother states she occasionally used a nipple shield with her last child when her nipples became sore for 1-2 weeks.  Discussed that is mother is using nipple shield with every feeding she needs to be pumping with DEBP. Suggest for now to post pump for 10 min with manual pump after feedings with NS and give volume back to baby.  Mother pumped both breasts and received approx 20 ml. Colostrum was spoon and given via slow flow nipple.  Provided mother with comfort gels.  Mother states she thinks she brought a bra with her but was unsure. She is tired due to cluster feeding last night.  She supplemented with formula due to exhaustion. Offered to set up DEBP and mother declined and stated she will use DEBP. Reviewed milk storage, cleaning and provided mother with lactation brochure with resources.     Maternal Data    Feeding Feeding Type: Breast Fed  LATCH Score Latch: Grasps breast easily, tongue down, lips flanged, rhythmical sucking.  Audible Swallowing: A few with stimulation  Type of Nipple: Everted at rest and after stimulation  Comfort (Breast/Nipple): Filling, red/small blisters or bruises, mild/mod discomfort  Hold (Positioning): No assistance needed to correctly position infant at breast.  LATCH Score: 8  Interventions Interventions: Breast feeding basics reviewed;Assisted with latch;Comfort gels;Hand pump  Lactation Tools Discussed/Used Tools:  Nipple Shields Nipple shield size: 24 Pump Review: Setup, frequency, and cleaning;Milk Storage Initiated by:: RN Date initiated:: 04/11/20   Consult Status Consult Status: Follow-up Date: 04/12/20 Follow-up type: In-patient    [name] [facility] 04/11/2020, 10:41 AM

## 2020-04-11 NOTE — Discharge Instructions (Signed)

## 2020-04-11 NOTE — Progress Notes (Signed)
POSTPARTUM PROGRESS NOTE  Subjective: Rachael Jackson is a 34 y.o. [id number] s/p VD at [redacted]w[redacted]d.  She reports she doing well. No acute events overnight. She denies any problems with ambulating, voiding or po intake. Denies nausea or vomiting. She has  passed flatus. Pain is well controlled.  Lochia is appropriate.  Objective: Blood pressure 106/67, pulse 67, temperature 98 F (36.7 C), temperature source Oral, resp. rate 18, last menstrual period 06/25/2019, SpO2 99 %, unknown if currently breastfeeding.  Physical Exam:  General: alert, cooperative and no distress Chest: no respiratory distress Abdomen: soft, non-tender  Uterine Fundus: firm, appropriately tender Extremities: No calf swelling or tenderness  no edema  Recent Labs    04/10/20 0222 04/11/20 0636  HGB 11.2* 9.6*  HCT 34.2* 30.1*    Assessment/Plan: Rachael Jackson is a 34 y.o. [id number] s/p VD at [redacted]w[redacted]d for SOL.  Routine Postpartum Care: Doing well, pain well-controlled.  -- Continue routine care, lactation support  -- Contraception: out-patient nexplanon -- Feeding: breast  Dispo: Plan for discharge tomorrow.  Evelena Leyden, DO PGY1 Family Medicine Resident

## 2020-04-12 NOTE — Lactation Note (Signed)
This note was copied from a baby's chart. Lactation Consultation Note  Patient Name: Girl Ronneisha Jett [id number] Date: 04/12/2020 Reason for consult: Follow-up assessment   P1, Baby 51 hours old and latched upon entering with intermittent swallows. Mother is breastfeeding and last night supplemented with formula. She states she is no longer using nipple shield.   For soreness she is alternating with coconut oil and comfort gels. Mother has manual pump.  Mother states breastfeeding has improved.  Denies questions.  Feed on demand with cues.  Goal 8-12+ times per day after first 24 hrs.  Place baby STS if not cueing.  Reviewed engorgement care and monitoring voids/stools.    Maternal Data    Feeding Feeding Type: Breast Fed  LATCH Score Latch: Grasps breast easily, tongue down, lips flanged, rhythmical sucking. (latched upon entering)  Audible Swallowing: A few with stimulation  Type of Nipple: Everted at rest and after stimulation  Comfort (Breast/Nipple): Filling, red/small blisters or bruises, mild/mod discomfort  Hold (Positioning): No assistance needed to correctly position infant at breast.  LATCH Score: 8  Interventions Interventions: Breast feeding basics reviewed;Comfort gels;Coconut oil;Hand pump  Lactation Tools Discussed/Used     Consult Status Consult Status: Complete Date: 04/12/20    [name] [facility] 04/12/2020, 8:46 AM

## 2020-04-13 ENCOUNTER — Encounter: Payer: Self-pay | Admitting: Family Medicine

## 2020-04-13 ENCOUNTER — Ambulatory Visit: Payer: Self-pay

## 2020-04-13 NOTE — Lactation Note (Signed)
This note was copied from a baby's chart. Lactation Consultation Note  Patient Name: Girl Jamarria Real [id number] Date: 04/13/2020 Reason for consult: Follow-up assessment;Hyperbilirubinemia;Infant weight loss;Term  Visited with mom of a 66 hours old FT female, she's a P4 and experienced BF. Baby has been in phototherapy since yesterday, but it will be now discontinued, bilirubin levels have improved; mom and baby are going home today, weight loss is at 2%. MD finishing up with discharge when entering the room, baby still wrapped around the blue blanket and exposed to natural sunlight on the couch next to mother.  Per mom, she no longer uses the NS, she's been pumping with a hand pump, and getting about 1-2 oz of EBM per pumping session, she's been giving it to baby using a bottle and a slow flow nipple, but LC couldn't find documentation of those bottle feedings with EBM in Flowsheets. LC saw evidence of such feeding in the room, mom showed LC the bottle with leftovers of colostrum, she's been consistently supplementing baby with both her EBM and Similac 20 calorie formula.  Reviewed discharge instructions, engorgement prevention/treatment, treatment/prevention for sore nipples and red flags on when to see baby's pediatrician. Encouraged mom to keep putting baby to breast at home and to pump after feedings to protect her supply. She'll continue supplementing baby with her EBM first prior offering formula. No support person in the room at the time of [facility] consultation. Mom reported all questions and concerns were answered, she's aware of LC OP services and will call PRN.   Maternal Data    Feeding Feeding Type: Bottle Fed - Formula Nipple Type: Slow - flow  LATCH Score Latch: Grasps breast easily, tongue down, lips flanged, rhythmical sucking.  Audible Swallowing: Spontaneous and intermittent  Type of Nipple: Everted at rest and after stimulation  Comfort (Breast/Nipple): Soft /  non-tender  Hold (Positioning): No assistance needed to correctly position infant at breast.  LATCH Score: 10  Interventions Interventions: Breast feeding basics reviewed;Hand express;Breast massage;Breast compression  Lactation Tools Discussed/Used     Consult Status Consult Status: Complete Date: 04/13/20 Follow-up type: Call as needed    Kizzie Cotten [place]ia Constable 04/13/2020, 11:30 AM

## 2021-03-31 ENCOUNTER — Emergency Department (HOSPITAL_COMMUNITY)
Admission: EM | Admit: 2021-03-31 | Discharge: 2021-04-01 | Disposition: A | Discharge status: Home / Self Care | Payer: Self-pay | Attending: Emergency Medicine | Admitting: Emergency Medicine

## 2021-03-31 ENCOUNTER — Other Ambulatory Visit: Payer: Self-pay

## 2021-03-31 DIAGNOSIS — R350 Frequency of micturition: Secondary | ICD-10-CM | POA: Insufficient documentation

## 2021-03-31 DIAGNOSIS — M549 Dorsalgia, unspecified: Secondary | ICD-10-CM | POA: Insufficient documentation

## 2021-03-31 DIAGNOSIS — R300 Dysuria: Secondary | ICD-10-CM | POA: Insufficient documentation

## 2021-03-31 DIAGNOSIS — R102 Pelvic and perineal pain: Secondary | ICD-10-CM | POA: Insufficient documentation

## 2021-03-31 DIAGNOSIS — R110 Nausea: Secondary | ICD-10-CM | POA: Insufficient documentation

## 2021-03-31 DIAGNOSIS — R1030 Lower abdominal pain, unspecified: Secondary | ICD-10-CM

## 2021-03-31 DIAGNOSIS — R3915 Urgency of urination: Secondary | ICD-10-CM | POA: Insufficient documentation

## 2021-03-31 DIAGNOSIS — K219 Gastro-esophageal reflux disease without esophagitis: Secondary | ICD-10-CM | POA: Insufficient documentation

## 2021-03-31 DIAGNOSIS — D72829 Elevated white blood cell count, unspecified: Secondary | ICD-10-CM | POA: Insufficient documentation

## 2021-03-31 LAB — URINALYSIS, ROUTINE W REFLEX MICROSCOPIC
Bacteria, UA: NONE SEEN
Bilirubin Urine: NEGATIVE
Glucose, UA: NEGATIVE mg/dL
Ketones, ur: NEGATIVE mg/dL
Nitrite: NEGATIVE
Protein, ur: NEGATIVE mg/dL
Specific Gravity, Urine: 1.016 (ref 1.005–1.030)
pH: 5 (ref 5.0–8.0)

## 2021-03-31 LAB — PREGNANCY, URINE: Preg Test, Ur: NEGATIVE

## 2021-03-31 NOTE — ED Triage Notes (Signed)
Pt c/o back pain and urinary frequency for past 3 weeks. [facility] and finished ABX on Sunday. Tenderness lower abdomen. Denies fevers/chills.

## 2021-03-31 NOTE — ED Provider Notes (Addendum)
Emergency Medicine Provider Triage Evaluation Note  Rachael Jackson , a 35 y.o. female  was evaluated in triage.  Pt complains of dysuria, frequency, and urgency x3 weeks. Denies fevers. Was tx with abx but sxs persisted  Review of Systems  Positive: Dysuria, frequency, urgency Negative: fevers  Physical Exam  BP 107/72 (BP Location: Right Arm)   Pulse 62   Temp 98.1 F (36.7 C) (Oral)   Resp 15   SpO2 99%  Gen:   Awake, no distress   Resp:  Normal effort  MSK:   Moves extremities without difficulty  Other:  Mild left lower abd ttp  Medical Decision Making  Medically screening exam initiated at 8:14 PM.  Appropriate orders placed.  Rachael Jackson was informed that the remainder of the evaluation will be completed by another provider, this initial triage assessment does not replace that evaluation, and the importance of remaining in the ED until their evaluation is complete.     Karrie Meres, PA-C 03/31/21 2015    Rachael Housley S, PA-C 03/31/21 2015    Gwyneth Sprout, MD 03/31/21 2231

## 2021-04-01 ENCOUNTER — Emergency Department (HOSPITAL_COMMUNITY): Payer: Self-pay

## 2021-04-01 MED ORDER — DICYCLOMINE HCL 20 MG PO TABS
20.0000 mg | ORAL_TABLET | Freq: Three times a day (TID) | ORAL | 0 refills | Status: DC | PRN
Start: 2021-04-01 — End: 2021-04-01

## 2021-04-01 MED ORDER — KETOROLAC TROMETHAMINE 30 MG/ML IJ SOLN
30.0000 mg | Freq: Once | INTRAMUSCULAR | Status: AC
  Administered 2021-04-01: 30 mg via INTRAVENOUS
  Filled 2021-04-01: qty 1

## 2021-04-01 NOTE — ED Provider Notes (Signed)
[facility] EMERGENCY DEPARTMENT Provider Note   CSN: [id number] Arrival date & time: 03/31/21  1947     History Chief Complaint  Patient presents with   Back Pain   Urinary Tract Infection   Abdominal Pain    Rachael Jackson is a 35 y.o. female who presents with dysuria, urinary frequency and urgency for about 3 weeks.  States she went to the health department on the [facility], and was prescribed Macrobid for 5 days.  States her symptoms went away while she was taking it and then returned.  He states she is having pain in her lower abdomen pelvic area, and also back pain.  Endorses nausea.  Denies vomiting, blood in urine, issues with bowel movements, fever, chills.  Denies vaginal discharge. States she was tested for STIs at the health department on the 19th and everything was negative. Declines wanting another pelvic exam. Denies taking any other medication or any past medical history other than previous frequent UTIs.  She states she is breast-feeding so would want to make sure that she can still breast-feed if she ends up needing medication.    Past Medical History:  Diagnosis Date   GERD (gastroesophageal reflux disease)    Medical history non-contributory     Patient Active Problem List   Diagnosis Date Noted   Indication for care or intervention in labor or delivery 04/10/2020   Normal delivery at term 04/10/2020   AMA (advanced maternal age) multigravida 35+, third trimester 01/20/2020   Supervision of low-risk pregnancy, third trimester 09/06/2019   Seasonal allergic rhinitis due to pollen 01/01/2017    Past Surgical History:  Procedure Laterality Date   NO PAST SURGERIES       OB History     Gravida  4   Para  4   Term  4   Preterm  0   AB  0   Living  4      SAB  0   IAB  0   Ectopic  0   Multiple  0   Live Births  2           Family History  Problem Relation Age of Onset   Alcohol abuse Neg Hx     Social  History   Tobacco Use   Smoking status: Never   Smokeless tobacco: Never  Vaping Use   Vaping Use: Never used  Substance Use Topics   Alcohol use: No   Drug use: No    Home Medications Prior to Admission medications   Medication Sig Start Date End Date Taking? Authorizing Provider  dicyclomine (BENTYL) 20 MG tablet Take 1 tablet (20 mg total) by mouth 3 (three) times daily as needed for spasms (pain). 04/01/21  Yes Alvira Monday, MD  acetaminophen (TYLENOL) 325 MG tablet Take 2 tablets (650 mg total) by mouth every 4 (four) hours as needed (for pain scale < 4). 04/11/20   Lilland, Alana, DO  cetirizine (ZYRTEC) 10 MG tablet Take 1 tablet (10 mg total) by mouth daily. Patient not taking: Reported on 04/11/2020 12/08/19   Sandre Kitty, MD  ibuprofen (ADVIL) 600 MG tablet Take 1 tablet (600 mg total) by mouth every 6 (six) hours. 04/11/20   Lilland, Alana, DO  Iron, Ferrous Sulfate, 325 (65 Fe) MG TABS Take 1 tablet by mouth daily. 02/19/20   Sandre Kitty, MD  Prenatal Vit-Fe Fumarate-FA (MULTIVITAMIN-PRENATAL) 27-0.8 MG TABS tablet Take 1 tablet by mouth daily at 12 noon.  [provider]  fluticasone (FLONASE) 50 MCG/ACT nasal spray Place 2 sprays into both nostrils daily. 09/24/18 07/16/19  Hoy Register, MD  omeprazole (PRILOSEC) 20 MG capsule Take 1 capsule (20 mg total) by mouth daily. 09/24/18 07/16/19  Hoy Register, MD    Allergies    Patient has no known allergies.  Review of Systems   Review of Systems  Constitutional:  Negative for fever.  Cardiovascular:  Negative for chest pain.  Gastrointestinal:  Positive for abdominal pain. Negative for constipation, diarrhea, nausea and vomiting.  Genitourinary:  Positive for decreased urine volume, difficulty urinating, dysuria, flank pain, frequency, pelvic pain and urgency. Negative for hematuria and vaginal discharge.  All other systems reviewed and are negative.  Physical Exam Updated Vital Signs BP 103/73 (BP  Location: Left Arm)   Pulse (!) 58   Temp 97.8 F (36.6 C) (Oral)   Resp 16   Ht 5\' 6"  (1.676 m)   Wt 82.8 kg   SpO2 99%   BMI 29.46 kg/m   Physical Exam Constitutional:      General: She is not in acute distress. HENT:     Head: Normocephalic and atraumatic.  Eyes:     Extraocular Movements: Extraocular movements intact.  Cardiovascular:     Rate and Rhythm: Normal rate and regular rhythm.  Pulmonary:     Effort: Pulmonary effort is normal.     Breath sounds: Normal breath sounds.  Abdominal:     General: There is no distension or abdominal bruit.     Palpations: Abdomen is soft.     Tenderness: There is generalized abdominal tenderness and tenderness in the suprapubic area. There is right CVA tenderness and left CVA tenderness.  Skin:    General: Skin is warm and dry.  Neurological:     Mental Status: She is alert.    ED Results / Procedures / Treatments   Labs (all labs ordered are listed, but only abnormal results are displayed) Labs Reviewed  URINALYSIS, ROUTINE W REFLEX MICROSCOPIC - Abnormal; Notable for the following components:      Result Value   Hgb urine dipstick SMALL (*)    Leukocytes,Ua SMALL (*)    All other components within normal limits  URINE CULTURE  PREGNANCY, URINE    EKG None  Radiology CT Renal Stone Study  Result Date: 04/01/2021 CLINICAL DATA:  35 year old female with flank pain.  UTI last week. EXAM: CT ABDOMEN AND PELVIS WITHOUT CONTRAST TECHNIQUE: Multidetector CT imaging of the abdomen and pelvis was performed following the standard protocol without IV contrast. COMPARISON:  None. FINDINGS: Lower chest: Negative. Hepatobiliary: Negative noncontrast liver and gallbladder. Pancreas: Negative. Spleen: Negative.  Incidental splenule. Adrenals/Urinary Tract: Normal adrenal glands. Noncontrast kidneys appear negative. No nephrolithiasis, hydronephrosis, or perinephric stranding. Just caudal to the left pararenal space there is a tiny  dystrophic calcification which appears inconsequential (coronal image 73). No hydroureter. No periureteral stranding. Multiple pelvic phleboliths, but no convincing ureteral calculus. Unremarkable bladder. Stomach/Bowel: Negative large bowel aside from redundancy and mild retained stool. Normal appendix on coronal image 54. Decompressed and negative terminal ileum. No dilated small bowel. Decompressed stomach and duodenum. No free air or free fluid. Vascular/Lymphatic: Normal caliber abdominal aorta. No calcified atherosclerosis. No lymphadenopathy. Reproductive: Negative noncontrast appearance. Other: No pelvic free fluid.  Multiple pelvic phleboliths. Musculoskeletal: Negative. IMPRESSION: Negative noncontrast CT Abdomen and Pelvis. No urinary calculus or obstructive uropathy. Electronically Signed   By: 31 M.D.   On: 04/01/2021 10:47  Procedures Procedures   Medications Ordered in ED Medications  ketorolac (TORADOL) 30 MG/ML injection 30 mg (30 mg Intravenous Given 04/01/21 0843)    ED Course  I have reviewed the triage vital signs and the nursing notes.  Pertinent labs & imaging results that were available during my care of the patient were reviewed by me and considered in my medical decision making (see chart for details).    MDM Rules/Calculators/A&P                           Patient is a 35 y.o. female who presents with dysuria, urinary frequency and urgency for about 3 weeks. Took Macrobid for 5 days with some relief in her symptoms, but then symptoms returned after completion of antibiotics.  Endorses pain in her lower abdomen and pelvic area, and is also her back.  Denies fever, chills. On exam, vitals stable. Abdomen tender to palpation in lower quadrants and suprapubic area with CVA tenderness. UA with small leukocytes, small hemoglobin. U preg neg. Obtained urine culture. CT renal stone study performed which was negative.  Unsure of exact cause of abdominal and suprapubic pain,  but unlikely to be UTI, pyelonephritis, nephrolithiasis, STI, PID.  Could potentially be due to interstitial cystitis. She was given Toradol for pain, and discharged with Bentyl as needed. Recommended PCP follow up and gave strict return precautions.   Final Clinical Impression(s) / ED Diagnoses Final diagnoses:  Lower abdominal pain    Rx / DC Orders ED Discharge Orders          Ordered    dicyclomine (BENTYL) 20 MG tablet  3 times daily PRN        04/01/21 1054             Cora Collum, DO 04/01/21 1149    Alvira Monday, MD 04/01/21 2339

## 2021-04-02 LAB — URINE CULTURE

## 2022-06-13 ENCOUNTER — Ambulatory Visit (INDEPENDENT_AMBULATORY_CARE_PROVIDER_SITE_OTHER): Payer: Self-pay | Admitting: Primary Care

## 2023-06-02 ENCOUNTER — Emergency Department (HOSPITAL_BASED_OUTPATIENT_CLINIC_OR_DEPARTMENT_OTHER): Payer: Self-pay

## 2023-06-02 ENCOUNTER — Encounter (HOSPITAL_BASED_OUTPATIENT_CLINIC_OR_DEPARTMENT_OTHER): Payer: Self-pay

## 2023-06-02 ENCOUNTER — Other Ambulatory Visit: Payer: Self-pay

## 2023-06-02 ENCOUNTER — Other Ambulatory Visit (HOSPITAL_BASED_OUTPATIENT_CLINIC_OR_DEPARTMENT_OTHER): Payer: Self-pay

## 2023-06-02 ENCOUNTER — Emergency Department (HOSPITAL_BASED_OUTPATIENT_CLINIC_OR_DEPARTMENT_OTHER)
Admission: EM | Admit: 2023-06-02 | Discharge: 2023-06-02 | Disposition: A | Discharge status: Home / Self Care | Payer: Self-pay | Attending: Emergency Medicine | Admitting: Emergency Medicine

## 2023-06-02 DIAGNOSIS — R300 Dysuria: Secondary | ICD-10-CM | POA: Insufficient documentation

## 2023-06-02 DIAGNOSIS — R7309 Other abnormal glucose: Secondary | ICD-10-CM | POA: Insufficient documentation

## 2023-06-02 DIAGNOSIS — R3915 Urgency of urination: Secondary | ICD-10-CM | POA: Insufficient documentation

## 2023-06-02 DIAGNOSIS — R350 Frequency of micturition: Secondary | ICD-10-CM | POA: Insufficient documentation

## 2023-06-02 DIAGNOSIS — N898 Other specified noninflammatory disorders of vagina: Secondary | ICD-10-CM | POA: Insufficient documentation

## 2023-06-02 DIAGNOSIS — R109 Unspecified abdominal pain: Secondary | ICD-10-CM | POA: Insufficient documentation

## 2023-06-02 LAB — CBC WITH DIFFERENTIAL/PLATELET
Abs Immature Granulocytes: 0.02 10*3/uL (ref 0.00–0.07)
Basophils Absolute: 0 10*3/uL (ref 0.0–0.1)
Basophils Relative: 1 %
Eosinophils Absolute: 0.1 10*3/uL (ref 0.0–0.5)
Eosinophils Relative: 2 %
HCT: 36.8 % (ref 36.0–46.0)
Hemoglobin: 12.6 g/dL (ref 12.0–15.0)
Immature Granulocytes: 0 %
Lymphocytes Relative: 25 %
Lymphs Abs: 1.4 10*3/uL (ref 0.7–4.0)
MCH: 32.6 pg (ref 26.0–34.0)
MCHC: 34.2 g/dL (ref 30.0–36.0)
MCV: 95.3 fL (ref 80.0–100.0)
Monocytes Absolute: 0.3 10*3/uL (ref 0.1–1.0)
Monocytes Relative: 5 %
Neutro Abs: 3.6 10*3/uL (ref 1.7–7.7)
Neutrophils Relative %: 67 %
Platelets: 198 10*3/uL (ref 150–400)
RBC: 3.86 MIL/uL — ABNORMAL LOW (ref 3.87–5.11)
RDW: 11.9 % (ref 11.5–15.5)
WBC: 5.4 10*3/uL (ref 4.0–10.5)
nRBC: 0 % (ref 0.0–0.2)

## 2023-06-02 LAB — COMPREHENSIVE METABOLIC PANEL
ALT: 28 U/L (ref 0–44)
AST: 17 U/L (ref 15–41)
Albumin: 4.3 g/dL (ref 3.5–5.0)
Alkaline Phosphatase: 54 U/L (ref 38–126)
Anion gap: 7 (ref 5–15)
BUN: 11 mg/dL (ref 6–20)
CO2: 25 mmol/L (ref 22–32)
Calcium: 9.1 mg/dL (ref 8.9–10.3)
Chloride: 106 mmol/L (ref 98–111)
Creatinine, Ser: 0.5 mg/dL (ref 0.44–1.00)
GFR, Estimated: >60 mL/min (ref >60)
Glucose, Bld: 101 mg/dL — ABNORMAL HIGH (ref 70–99)
Potassium: 4 mmol/L (ref 3.5–5.1)
Sodium: 138 mmol/L (ref 135–145)
Total Bilirubin: 0.5 mg/dL (ref 0.3–1.2)
Total Protein: 7.1 g/dL (ref 6.5–8.1)

## 2023-06-02 LAB — WET PREP, GENITAL
Clue Cells Wet Prep HPF POC: NONE SEEN
Sperm: NONE SEEN
Trich, Wet Prep: NONE SEEN
WBC, Wet Prep HPF POC: >=10 — AB (ref <10)
Yeast Wet Prep HPF POC: NONE SEEN

## 2023-06-02 LAB — URINALYSIS, ROUTINE W REFLEX MICROSCOPIC
Bilirubin Urine: NEGATIVE
Glucose, UA: NEGATIVE mg/dL
Hgb urine dipstick: NEGATIVE
Ketones, ur: NEGATIVE mg/dL
Leukocytes,Ua: NEGATIVE
Nitrite: NEGATIVE
Protein, ur: NEGATIVE mg/dL
Specific Gravity, Urine: 1.009 (ref 1.005–1.030)
pH: 7.5 (ref 5.0–8.0)

## 2023-06-02 LAB — PREGNANCY, URINE: Preg Test, Ur: NEGATIVE

## 2023-06-02 MED ORDER — NITROFURANTOIN MONOHYD MACRO 100 MG PO CAPS
100.0000 mg | ORAL_CAPSULE | Freq: Two times a day (BID) | ORAL | 0 refills | Status: DC
Start: 2023-06-02 — End: 2023-06-02
  Filled 2023-06-02: qty 10, 5d supply, fill #0

## 2023-06-02 MED ORDER — KETOROLAC TROMETHAMINE 15 MG/ML IJ SOLN
15.0000 mg | Freq: Once | INTRAMUSCULAR | Status: AC
  Administered 2023-06-02: 15 mg via INTRAVENOUS
  Filled 2023-06-02: qty 1

## 2023-06-02 MED ORDER — NITROFURANTOIN MONOHYD MACRO 100 MG PO CAPS
100.0000 mg | ORAL_CAPSULE | Freq: Once | ORAL | Status: AC
  Administered 2023-06-02: 100 mg via ORAL
  Filled 2023-06-02: qty 1

## 2023-06-02 MED ORDER — NITROFURANTOIN MONOHYD MACRO 100 MG PO CAPS
100.0000 mg | ORAL_CAPSULE | Freq: Two times a day (BID) | ORAL | 0 refills | Status: AC
  Filled 2023-06-02: qty 10, 5d supply, fill #0

## 2023-06-02 NOTE — Discharge Instructions (Addendum)
You were seen in the emergency room today for evaluation of your pain with urination.  Given your symptoms, I am going to start you on a different antibiotic.  This is called Macrobid which will take twice a day for the next 5 days.  You can take Tylenol ibuprofen as needed for pain.  Additionally, I would like for you to follow-up with urologist.  I included information for 1 in the discharge paperwork.  Please make sure you call to schedule an appointment.  Additionally, please make sure you follow with your primary care provider and your gynecologist for your cervix.  If you have any concerns, new or worsening symptoms, please return to your nearest emergency department for evaluation.  Contact a health care provider if: You have a fever. You develop pain in your back or sides. You have nausea or vomiting. You have blood in your urine. You are not urinating as often as you usually do. Get help right away if: Your pain is severe and not relieved with medicines. You cannot eat or drink without vomiting. You are confused. You have a rapid heartbeat while resting. You have shaking or chills. You feel extremely weak.

## 2023-06-02 NOTE — ED Triage Notes (Addendum)
In for eval of left flank pain onset yesterday. Dysuria,  frequency, and retention. 2 weeks ago she had dysuria and hematuria and was seen at urgent care. Rx antibiotic for UTI. Nausea but denies vomiting.

## 2023-06-02 NOTE — ED Notes (Signed)
In as chaperone with Achille Rich, PA at this time.

## 2023-06-02 NOTE — ED Notes (Signed)
Dc instructions reviewed with patient. Patient voiced understanding. Dc with belongings.  °

## 2023-06-02 NOTE — ED Provider Notes (Signed)
[place] EMERGENCY DEPARTMENT AT [facility] Provider Note   CSN: [id number] Arrival date & time: 06/02/23  1052     History Chief Complaint  Patient presents with   Flank Pain    Rachael Jackson is a 37 y.o. female reportedly otherwise healthy presents emerged from today for evaluation of dysuria since 05-17-2023.  Patient reports that she is been having left flank pain intermittently as well as of hematuria with urgency frequency, chills, and nausea.  She reports that she was seen at urgent care the day after was discharged home on Keflex.  She reports that she almost finished the entirety of the course but was called to say that her culture did not grow back anything to stop taking it.  She reports that the hematuria only lasted for 2 days and now has only been having the dysuria with urgency and frequency.  She denies any polyuria.  She denies any fever or chills.  She reports he occasionally has some lower suprapubic cramp but is also been having some left flank pain intermittently as well.  She denies any vaginal discharge or any vaginal bleeding.  She denies any concern for any STDs.  She is here today as her symptoms have not improved.  She denies any medical or surgical history.  Denies any other medications.  No known drug allergies.  Denies any tobacco, EtOH, other drug use.   Flank Pain Pertinent negatives include no chest pain, no abdominal pain and no shortness of breath.       Home Medications Prior to Admission medications   Medication Sig Start Date End Date Taking? Authorizing Provider  acetaminophen (TYLENOL) 325 MG tablet Take 2 tablets (650 mg total) by mouth every 4 (four) hours as needed (for pain scale < 4). 04/11/20   Lilland, Alana, DO  cetirizine (ZYRTEC) 10 MG tablet Take 1 tablet (10 mg total) by mouth daily. Patient not taking: Reported on 04/11/2020 12/08/19   Sandre Kitty, MD  ibuprofen (ADVIL) 600 MG tablet Take 1 tablet (600 mg total) by  mouth every 6 (six) hours. 04/11/20   Lilland, Alana, DO  Iron, Ferrous Sulfate, 325 (65 Fe) MG TABS Take 1 tablet by mouth daily. 02/19/20   Sandre Kitty, MD  nitrofurantoin, macrocrystal-monohydrate, (MACROBID) 100 MG capsule Take 1 capsule (100 mg total) by mouth 2 (two) times daily. 06/02/23   Achille Rich, PA-C  Prenatal Vit-Fe Fumarate-FA (MULTIVITAMIN-PRENATAL) 27-0.8 MG TABS tablet Take 1 tablet by mouth daily at 12 noon.    [provider]  fluticasone (FLONASE) 50 MCG/ACT nasal spray Place 2 sprays into both nostrils daily. 09/24/18 07/16/19  Hoy Register, MD  omeprazole (PRILOSEC) 20 MG capsule Take 1 capsule (20 mg total) by mouth daily. 09/24/18 07/16/19  Hoy Register, MD      Allergies    Patient has no known allergies.    Review of Systems   Review of Systems  Constitutional:  Negative for chills and fever.  Respiratory:  Negative for shortness of breath.   Cardiovascular:  Negative for chest pain.  Gastrointestinal:  Positive for nausea. Negative for abdominal pain, constipation, diarrhea and vomiting.  Genitourinary:  Positive for dysuria, flank pain, frequency and urgency. Negative for hematuria (Resolved), vaginal bleeding and vaginal discharge.  Musculoskeletal:  Negative for back pain.    Physical Exam Updated Vital Signs BP 103/73   Pulse 69   Temp 98.2 F (36.8 C) (Oral)   Resp 17   Ht 5\' 4"  (1.626 m)  Wt 68 kg   SpO2 100%   BMI 25.75 kg/m  Physical Exam Vitals and nursing note reviewed. Exam conducted with a chaperone present Misty Stanley J).  Constitutional:      General: She is not in acute distress.    Appearance: She is not ill-appearing or toxic-appearing.  HENT:     Mouth/Throat:     Mouth: Mucous membranes are moist.  Eyes:     General: No scleral icterus. Cardiovascular:     Rate and Rhythm: Normal rate.  Pulmonary:     Effort: Pulmonary effort is normal. No respiratory distress.  Abdominal:     Palpations: Abdomen is soft.      Tenderness: There is no abdominal tenderness. There is no right CVA tenderness, left CVA tenderness, guarding or rebound.  Genitourinary:    Urethra: No prolapse, urethral swelling or urethral lesion.     Vagina: Normal.     Cervix: Friability present. No cervical motion tenderness.     Comments: There is some darker blood mucous present in the vaginal canal and cervical os. Very small amount. She does have some thicker vaginal discharge present with some cervical friability, but no cervical enlargement or CMT.  Lymphadenopathy:     Lower Body: No right inguinal adenopathy. No left inguinal adenopathy.  Skin:    General: Skin is warm and dry.  Neurological:     Mental Status: She is alert.     ED Results / Procedures / Treatments   Labs (all labs ordered are listed, but only abnormal results are displayed) Labs Reviewed  WET PREP, GENITAL - Abnormal; Notable for the following components:      Result Value   WBC, Wet Prep HPF POC >=10 (*)    All other components within normal limits  CBC WITH DIFFERENTIAL/PLATELET - Abnormal; Notable for the following components:   RBC 3.86 (*)    All other components within normal limits  COMPREHENSIVE METABOLIC PANEL - Abnormal; Notable for the following components:   Glucose, Bld 101 (*)    All other components within normal limits  URINALYSIS, ROUTINE W REFLEX MICROSCOPIC - Abnormal; Notable for the following components:   Color, Urine COLORLESS (*)    All other components within normal limits  PREGNANCY, URINE  GC/CHLAMYDIA PROBE AMP ([place]) NOT AT [facility]    EKG None  Radiology CT Renal Stone Study  Result Date: 06/02/2023 CLINICAL DATA:  Abdominal/flank pain, stone suspected. Left flank pain since yesterday with dysuria, frequency and retention. EXAM: CT ABDOMEN AND PELVIS WITHOUT CONTRAST TECHNIQUE: Multidetector CT imaging of the abdomen and pelvis was performed following the standard protocol without IV contrast. RADIATION DOSE  REDUCTION: This exam was performed according to the departmental dose-optimization program which includes automated exposure control, adjustment of the mA and/or kV according to patient size and/or use of iterative reconstruction technique. COMPARISON:  Abdominopelvic CT 04/01/2021 FINDINGS: Lower chest: Clear lung bases. No significant pleural or pericardial effusion. Hepatobiliary: The liver appears unremarkable as imaged in the noncontrast state. No evidence of gallstones, gallbladder wall thickening or biliary dilatation. Pancreas: Unremarkable. No pancreatic ductal dilatation or surrounding inflammatory changes. Spleen: Normal in size without focal abnormality. Adrenals/Urinary Tract: Both adrenal glands appear normal. No evidence of urinary tract calculus, suspicious renal lesion or hydronephrosis. The bladder appears unremarkable for its degree of distention. Stomach/Bowel: No enteric contrast administered. The stomach appears unremarkable for its degree of distension. No evidence of bowel wall thickening, distention or surrounding inflammatory change. The appendix appears normal. Vascular/Lymphatic:  There are no enlarged abdominal or pelvic lymph nodes. Mildly prominent ileocolonic mesenteric lymph nodes are similar to the previous study, likely reactive. No significant vascular findings on noncontrast imaging. Reproductive: The uterus and ovaries appear unremarkable. No adnexal mass. Other: No evidence of abdominal wall mass or hernia. No ascites or pneumoperitoneum. Musculoskeletal: No acute or significant osseous findings. Minimal convex right scoliosis. Unless specific follow-up recommendations are mentioned in the findings or impression sections, no imaging follow-up of any mentioned incidental findings is recommended. IMPRESSION: No acute findings or explanation for the patient's symptoms. Specifically, no evidence of urinary tract calculus or hydronephrosis. Electronically Signed   By: Carey Bullocks  M.D.   On: 06/02/2023 12:48    Procedures Procedures   Medications Ordered in ED Medications  ketorolac (TORADOL) 15 MG/ML injection 15 mg (15 mg Intravenous Given 06/02/23 1503)  nitrofurantoin (macrocrystal-monohydrate) (MACROBID) capsule 100 mg (100 mg Oral Given 06/02/23 1729)    ED Course/ Medical Decision Making/ A&P    Medical Decision Making Amount and/or Complexity of Data Reviewed Labs: ordered. Radiology: ordered.  Risk Prescription drug management.   37 y.o. female presents to the ER for evaluation of dysuria with left flank pain. Differential diagnosis includes but is not limited to interstitial cystitis, nephrolithiasis, cystitis, PID. Vital signs unremarkable. Physical exam as noted above.   I independently reviewed and interpreted the patient's labs.  CBC without leukocytosis or anemia.  Urinalysis is colorless dilute urine.  Pregnancy test is negative.  CMP shows mildly elevated glucose at 101 otherwise no electrolyte or LFT abnormalities.  Wet prep shows greater than 10 white blood cells which is nonspecific.  GC pending.  CT renal shows no acute findings or explanation for the patient's symptoms. Specifically, no evidence of urinary tract calculus or hydronephrosis.   On pelvic exam, patient does have some cervical friability.  There is some darker bloody mucus present in the vaginal canal.  Very small amount.  I question if she was actually having some hematuria or if she was just having some vaginal bleeding during these episodes in the weeks prior.  Given that she is having some dysuria I will place her on an antibiotic given that she has been recurrent for the past 2 weeks.  Will place her on a different medication other than Keflex we will try Macrobid.  She reports significant proven of her pain with the Toradol.  Also in the differential is a recently passed kidney stone.  Otherwise, the patient is hemodynamically stable and is well-appearing.  No significant lab  abnormality.  I will lower excision for any PID given that she reports she has no concern for any STDs and does not have any cervical motion tenderness up upon palpation.  She does have the Nexplanon.  Question that she may be having some cramping is making her feel like she is having increase in urination or frequency of urination.  Her abdomen is soft and nontender to palpation.  Will have her follow-up with urology as well.  Given the friability of her cervix, I was concerned.  She reports that she is had a Pap smear in the past few years and has never had an abnormal 1.  She reports that she does have a gynecologist in the area and I recommended that she follow-up with them for thinks it is HPV or malignancy.  We discussed the results of the labs/imaging. The plan is follow up with gyn, PCP, and urology. Take antibiotics. We discussed strict return precautions  and red flag symptoms. The patient verbalized their understanding and agrees to the plan. The patient is stable and being discharged home in good condition.  Portions of this report may have been transcribed using voice recognition software. Every effort was made to ensure accuracy; however, inadvertent computerized transcription errors may be present.   Final Clinical Impression(s) / ED Diagnoses Final diagnoses:  Dysuria    Rx / DC Orders ED Discharge Orders          Ordered    nitrofurantoin, macrocrystal-monohydrate, (MACROBID) 100 MG capsule  2 times daily,   Status:  Discontinued        06/02/23 1711    nitrofurantoin, macrocrystal-monohydrate, (MACROBID) 100 MG capsule  2 times daily        06/02/23 1824              Achille Rich, PA-C 06/02/23 2039    Margarita Grizzle, MD 06/03/23 [phone]

## 2023-06-03 ENCOUNTER — Other Ambulatory Visit: Payer: Self-pay

## 2023-06-04 LAB — GC/CHLAMYDIA PROBE AMP (~~LOC~~) NOT AT ARMC
Chlamydia: NEGATIVE
Comment: NEGATIVE
Comment: NORMAL
Neisseria Gonorrhea: NEGATIVE

## 2023-06-05 ENCOUNTER — Other Ambulatory Visit: Payer: Self-pay

## 2023-06-27 IMAGING — CT CT RENAL STONE PROTOCOL
2 of 4 series · 16 of 46 positions shown, 18 images · non-contrast
Comparison: None.

CLINICAL DATA: 35-year-old female with flank pain.  UTI last week.

EXAM:
CT ABDOMEN AND PELVIS WITHOUT CONTRAST
TECHNIQUE: Multidetector CT imaging of the abdomen and pelvis was performed
following the standard protocol without IV contrast.

[Series 3: ap without · axial · non-contrast · 0.67mm/px · z∈[+766,+1156]mm · 13 of 88 slices shown, 15 images]
[im 5/88  soft-tissue]
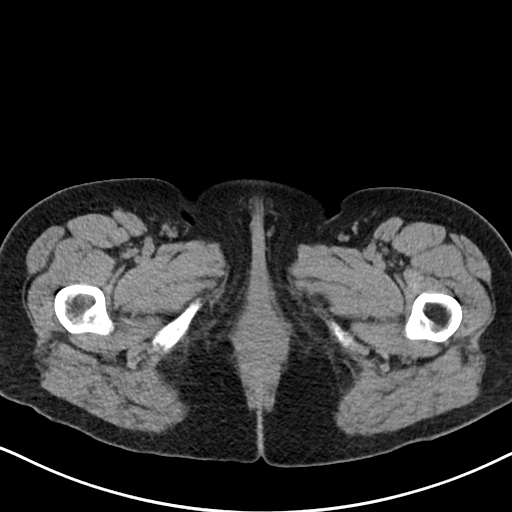
[im 5/88  bone]
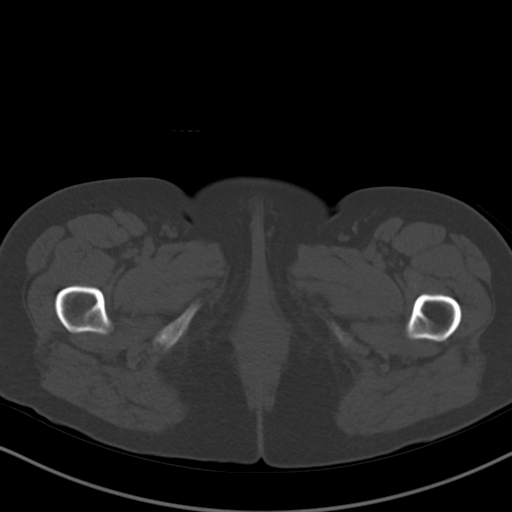
[im 14/88  soft-tissue]
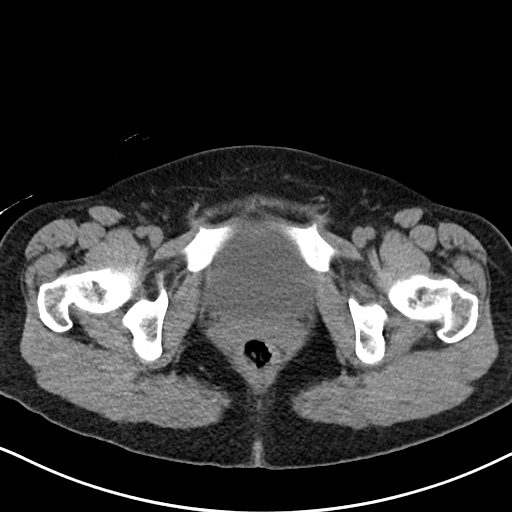
[im 19/88  soft-tissue]
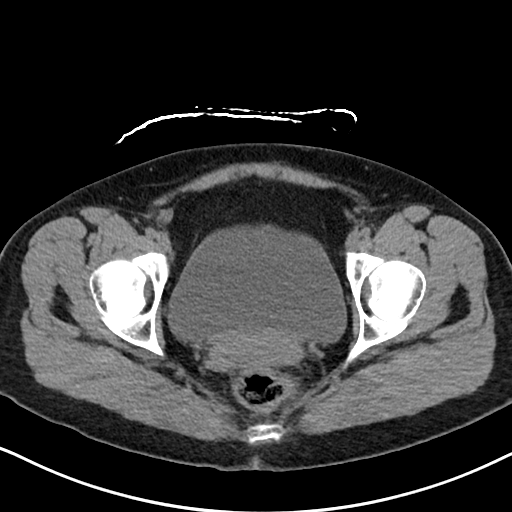
[im 23/88  soft-tissue]
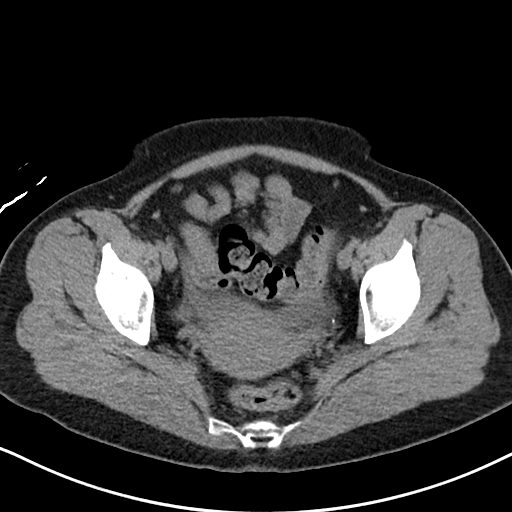
[im 33/88  soft-tissue]
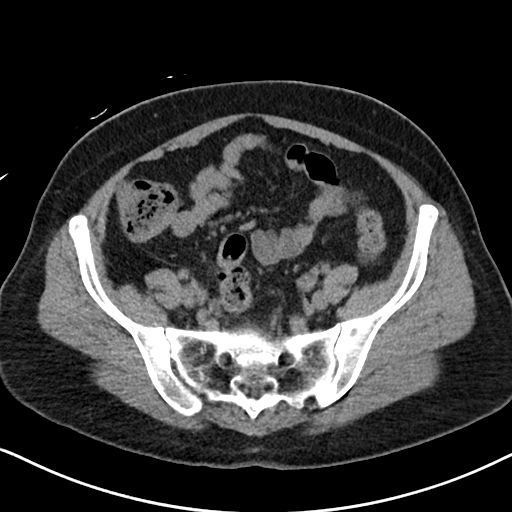
[im 37/88  soft-tissue]
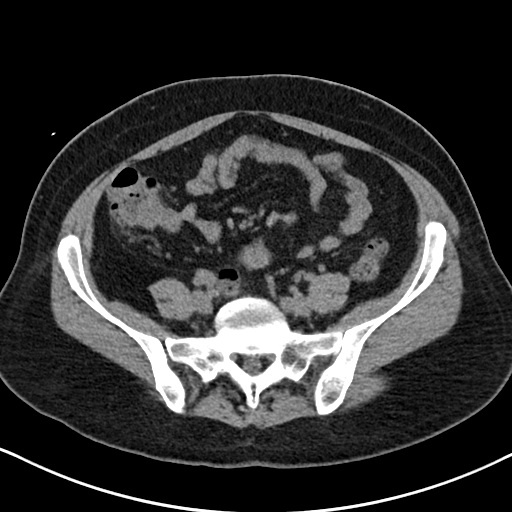
[im 46/88  soft-tissue]
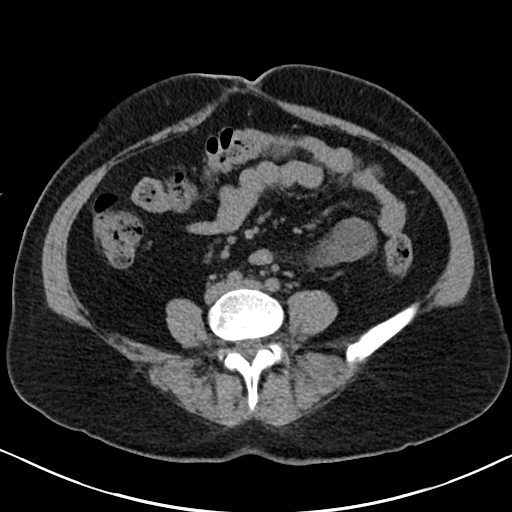
[im 51/88  soft-tissue]
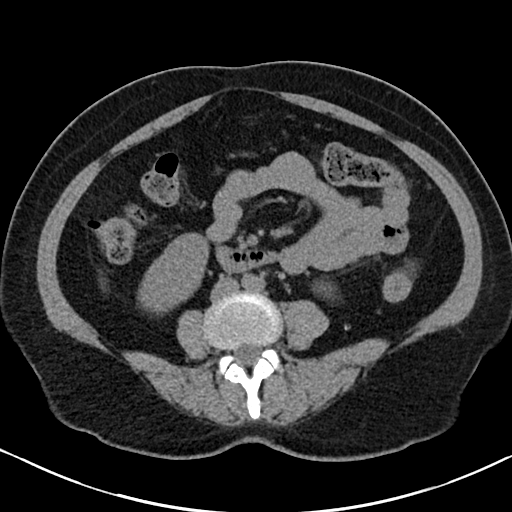
[im 55/88  soft-tissue]
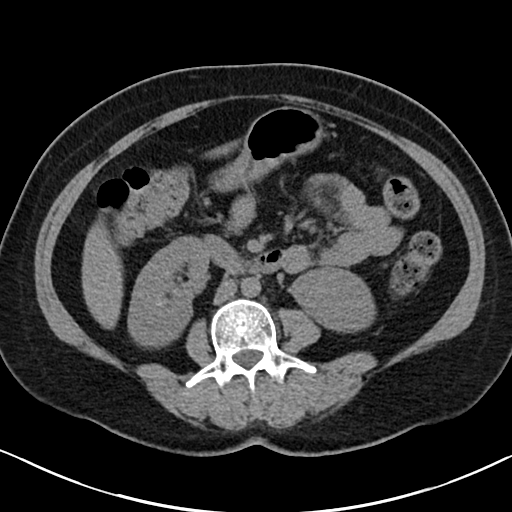
[im 55/88  bone]
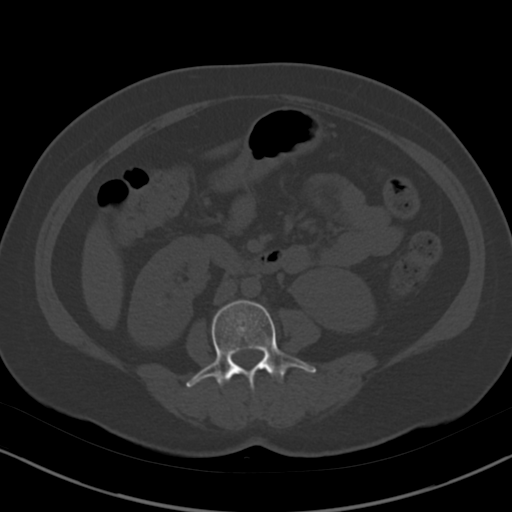
[im 65/88  soft-tissue]
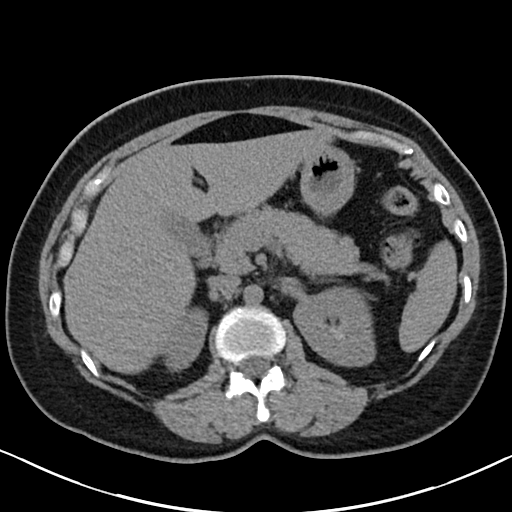
[im 69/88  soft-tissue]
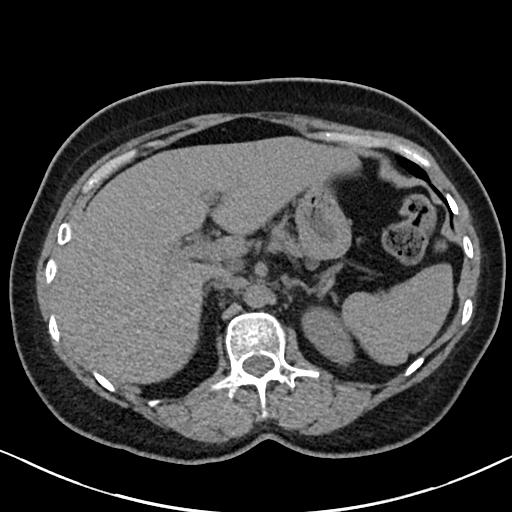
[im 74/88  soft-tissue]
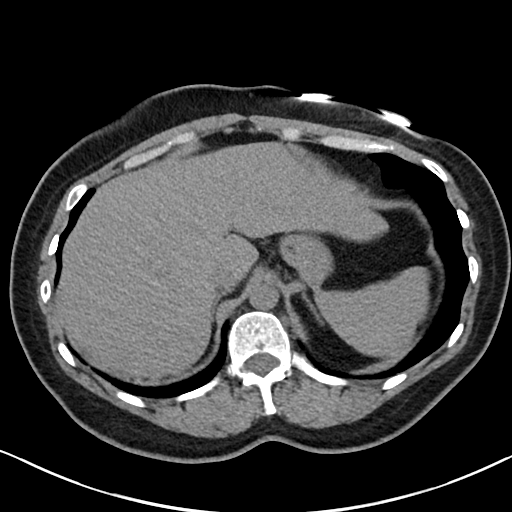
[im 83/88  soft-tissue]
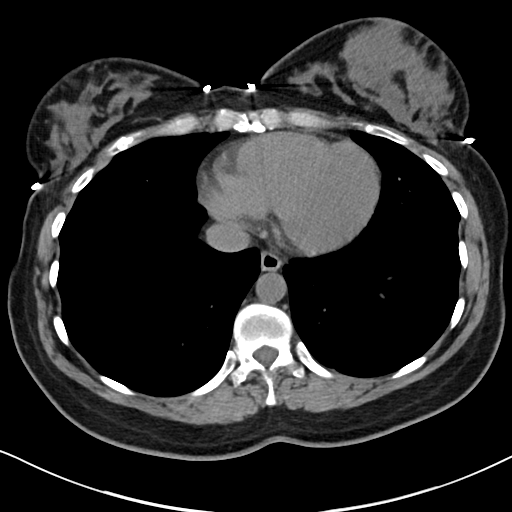

[Series 6: cor · coronal · 0.68mm/px · 3 of 105 slices shown]
[im 35/105  soft-tissue]
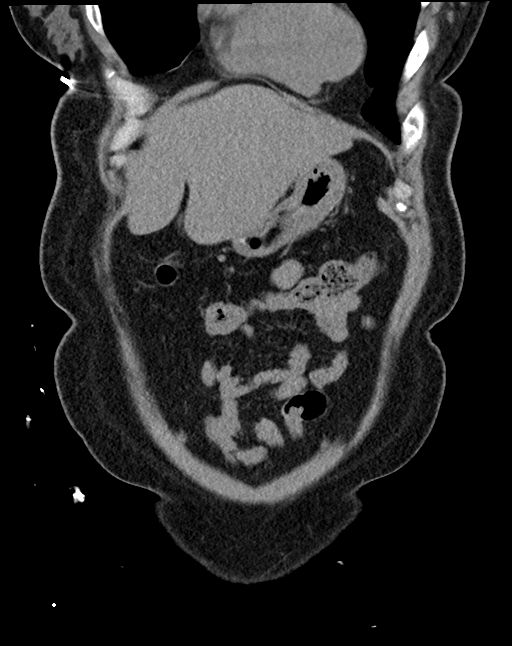
[im 47/105  soft-tissue]
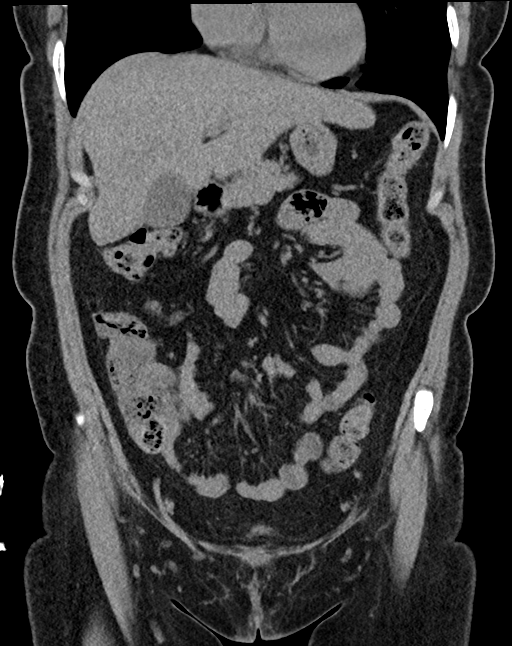
[im 58/105  soft-tissue]
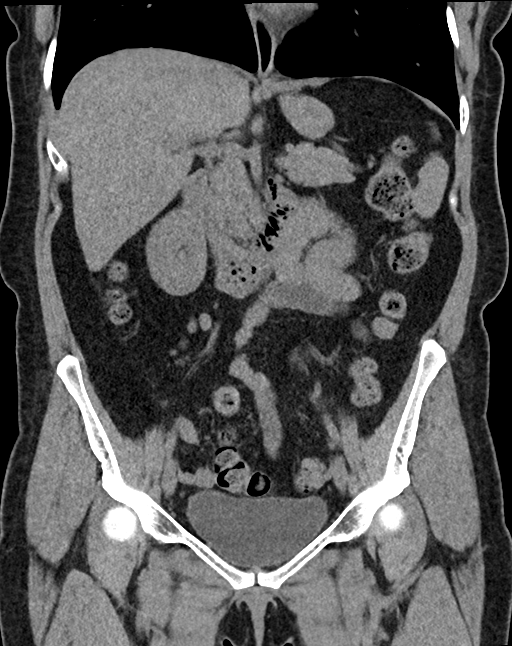

[16 of 46 positions shown; findings below may reference images not displayed]

FINDINGS: Lower chest: Negative.

Hepatobiliary: Negative noncontrast liver and gallbladder.

Pancreas: Negative.

Spleen: Negative.  Incidental splenule.

Adrenals/Urinary Tract: Normal adrenal glands. Noncontrast kidneys
appear negative. No nephrolithiasis, hydronephrosis, or perinephric
stranding. Just caudal to the left pararenal space there is a tiny
dystrophic calcification which appears inconsequential (coronal
image 73). No hydroureter. No periureteral stranding. Multiple
pelvic phleboliths, but no convincing ureteral calculus.
Unremarkable bladder.

Stomach/Bowel: Negative large bowel aside from redundancy and mild
retained stool. Normal appendix on coronal image 54. Decompressed
and negative terminal ileum. No dilated small bowel. Decompressed
stomach and duodenum. No free air or free fluid.

Vascular/Lymphatic: Normal caliber abdominal aorta. No calcified
atherosclerosis. No lymphadenopathy.

Reproductive: Negative noncontrast appearance.

Other: No pelvic free fluid.  Multiple pelvic phleboliths.

Musculoskeletal: Negative.
IMPRESSION: Negative noncontrast CT Abdomen and Pelvis. No urinary calculus or
obstructive uropathy.

## 2023-11-07 ENCOUNTER — Other Ambulatory Visit: Payer: Self-pay

## 2023-11-07 MED ORDER — OXYBUTYNIN CHLORIDE 5 MG PO TABS
5.0000 mg | ORAL_TABLET | Freq: Three times a day (TID) | ORAL | 0 refills | Status: AC | PRN
  Filled 2023-11-07: qty 60, 20d supply, fill #0

## 2023-11-19 ENCOUNTER — Other Ambulatory Visit: Payer: Self-pay
